# Patient Record
Sex: Female | Born: 1955 | Race: White | Hispanic: No | Smoking: Never smoker
Health system: Southern US, Community
[De-identification: ages and names within clinical notes are randomized; demographics above are authoritative.]

## PROBLEM LIST (undated history)

## (undated) DIAGNOSIS — R32 Unspecified urinary incontinence: Secondary | ICD-10-CM

## (undated) DIAGNOSIS — J189 Pneumonia, unspecified organism: Secondary | ICD-10-CM

## (undated) DIAGNOSIS — M1990 Unspecified osteoarthritis, unspecified site: Secondary | ICD-10-CM

## (undated) DIAGNOSIS — M11261 Other chondrocalcinosis, right knee: Secondary | ICD-10-CM

## (undated) DIAGNOSIS — M545 Low back pain, unspecified: Secondary | ICD-10-CM

## (undated) DIAGNOSIS — F329 Major depressive disorder, single episode, unspecified: Secondary | ICD-10-CM

## (undated) DIAGNOSIS — D519 Vitamin B12 deficiency anemia, unspecified: Secondary | ICD-10-CM

## (undated) DIAGNOSIS — T4145XA Adverse effect of unspecified anesthetic, initial encounter: Secondary | ICD-10-CM

## (undated) DIAGNOSIS — I10 Essential (primary) hypertension: Secondary | ICD-10-CM

## (undated) DIAGNOSIS — F32A Depression, unspecified: Secondary | ICD-10-CM

## (undated) DIAGNOSIS — D509 Iron deficiency anemia, unspecified: Secondary | ICD-10-CM

## (undated) DIAGNOSIS — G971 Other reaction to spinal and lumbar puncture: Secondary | ICD-10-CM

## (undated) DIAGNOSIS — G43909 Migraine, unspecified, not intractable, without status migrainosus: Secondary | ICD-10-CM

## (undated) DIAGNOSIS — T8859XA Other complications of anesthesia, initial encounter: Secondary | ICD-10-CM

## (undated) DIAGNOSIS — Z9109 Other allergy status, other than to drugs and biological substances: Secondary | ICD-10-CM

## (undated) DIAGNOSIS — Z9289 Personal history of other medical treatment: Secondary | ICD-10-CM

## (undated) DIAGNOSIS — G8929 Other chronic pain: Secondary | ICD-10-CM

## (undated) DIAGNOSIS — J45909 Unspecified asthma, uncomplicated: Secondary | ICD-10-CM

## (undated) HISTORY — PX: KNEE ARTHROPLASTY: SHX992

## (undated) HISTORY — PX: KNEE ARTHROSCOPY: SHX127

## (undated) HISTORY — PX: BACK SURGERY: SHX140

## (undated) HISTORY — PX: CARPAL TUNNEL RELEASE: SHX101

## (undated) HISTORY — PX: VAGINAL HYSTERECTOMY: SUR661

## (undated) HISTORY — PX: KNEE RECONSTRUCTION: SHX5883

## (undated) HISTORY — PX: COLONOSCOPY W/ BIOPSIES AND POLYPECTOMY: SHX1376

## (undated) HISTORY — PX: SYNOVIAL CYST EXCISION: SUR507

## (undated) HISTORY — PX: SHOULDER ARTHROSCOPY W/ ROTATOR CUFF REPAIR: SHX2400

## (undated) HISTORY — PX: JOINT REPLACEMENT: SHX530

---

## 1996-12-25 HISTORY — PX: TONSILLECTOMY AND ADENOIDECTOMY: SUR1326

## 2010-01-20 ENCOUNTER — Inpatient Hospital Stay (HOSPITAL_COMMUNITY): Admission: RE | Admit: 2010-01-20 | Discharge: 2010-01-23 | Payer: Self-pay | Admitting: Orthopedic Surgery

## 2011-03-13 LAB — CBC
HCT: 26 % — ABNORMAL LOW (ref 36.0–46.0)
HCT: 35.4 % — ABNORMAL LOW (ref 36.0–46.0)
Hemoglobin: 12.1 g/dL (ref 12.0–15.0)
Hemoglobin: 8.6 g/dL — ABNORMAL LOW (ref 12.0–15.0)
MCHC: 33.4 g/dL (ref 30.0–36.0)
MCHC: 33.8 g/dL (ref 30.0–36.0)
MCHC: 34.2 g/dL (ref 30.0–36.0)
MCV: 89.6 fL (ref 78.0–100.0)
Platelets: 341 10*3/uL (ref 150–400)
Platelets: 373 10*3/uL (ref 150–400)
RBC: 2.92 MIL/uL — ABNORMAL LOW (ref 3.87–5.11)
RBC: 3.99 MIL/uL (ref 3.87–5.11)
RDW: 12.5 % (ref 11.5–15.5)
RDW: 12.5 % (ref 11.5–15.5)
RDW: 12.9 % (ref 11.5–15.5)
WBC: 6.4 10*3/uL (ref 4.0–10.5)

## 2011-03-13 LAB — URINALYSIS, ROUTINE W REFLEX MICROSCOPIC
Glucose, UA: NEGATIVE mg/dL
Hgb urine dipstick: NEGATIVE
Ketones, ur: NEGATIVE mg/dL
Specific Gravity, Urine: 1.021 (ref 1.005–1.030)
Urobilinogen, UA: 0.2 mg/dL (ref 0.0–1.0)
pH: 5.5 (ref 5.0–8.0)

## 2011-03-13 LAB — DIFFERENTIAL
Basophils Relative: 0 % (ref 0–1)
Eosinophils Relative: 3 % (ref 0–5)
Lymphocytes Relative: 35 % (ref 12–46)
Monocytes Absolute: 0.5 10*3/uL (ref 0.1–1.0)
Monocytes Relative: 10 % (ref 3–12)
Neutrophils Relative %: 53 % (ref 43–77)

## 2011-03-13 LAB — BASIC METABOLIC PANEL
BUN: 12 mg/dL (ref 6–23)
CO2: 23 mEq/L (ref 19–32)
Chloride: 108 mEq/L (ref 96–112)
Creatinine, Ser: 0.69 mg/dL (ref 0.4–1.2)
Creatinine, Ser: 1.33 mg/dL — ABNORMAL HIGH (ref 0.4–1.2)
GFR calc Af Amer: 51 mL/min — ABNORMAL LOW (ref >60)
GFR calc non Af Amer: 42 mL/min — ABNORMAL LOW (ref >60)
GFR calc non Af Amer: >60 mL/min (ref >60)
Potassium: 4.5 mEq/L (ref 3.5–5.1)

## 2011-03-13 LAB — CROSSMATCH

## 2011-03-13 LAB — COMPREHENSIVE METABOLIC PANEL
AST: 22 U/L (ref 0–37)
CO2: 29 mEq/L (ref 19–32)
GFR calc Af Amer: >60 mL/min (ref >60)
Sodium: 140 mEq/L (ref 135–145)
Total Protein: 7.3 g/dL (ref 6.0–8.3)

## 2011-03-13 LAB — PROTIME-INR: INR: 0.95 (ref 0.00–1.49)

## 2013-06-10 ENCOUNTER — Encounter (HOSPITAL_COMMUNITY): Payer: Self-pay | Admitting: Pharmacy Technician

## 2013-06-10 NOTE — Pre-Procedure Instructions (Addendum)
Danielle Schmitt  06/10/2013   Your procedure is scheduled on:  Tues, June 24 @ 7:30 AM  Report to [facility] at 5:30 AM.  Call this number if you have problems the morning of surgery: [phone]   Remember:   Do not eat food or drink liquids after midnight.   Take these medicines the morning of surgery with A SIP OF WATER: Albuterol<Bring Your Inhaler With You>,Adderall(Amphetamine-Dextroamphetamine),Cymbalta(Duloxetine),Claritin(Loratadine),Nasonex(Mometasone),and Pain Pill(if needed) Stop taking Aspirin and herbal medications. Do not take any NSAIDs ie: Ibuprofen, Advil, Naproxen or any medication containing Aspirin ( Celebrex)  Do not wear jewelry, make-up or nail polish.  Do not wear lotions, powders, or perfumes. You may wear deodorant.  Do not shave 48 hours prior to surgery.  Do not bring valuables to the hospital.  [facility] is not responsible  for any belongings or valuables.  Contacts, dentures or bridgework may not be worn into surgery.  Leave suitcase in the car. After surgery it may be brought to your room.  For patients admitted to the hospital, checkout time is 11:00 AM the day of discharge.   Patients discharged the day of surgery will not be allowed to drive home.    Special Instructions: Shower using CHG 2 nights before surgery and the night before surgery.  If you shower the day of surgery use CHG.  Use special wash - you have one bottle of CHG for all showers.  You should use approximately 1/3 of the bottle for each shower.   Please read over the following fact sheets that you were given: Pain Booklet, Coughing and Deep Breathing, Blood Transfusion Information, Total Joint Packet, MRSA Information and Surgical Site Infection Prevention

## 2013-06-11 ENCOUNTER — Encounter (HOSPITAL_COMMUNITY): Payer: Self-pay

## 2013-06-11 ENCOUNTER — Encounter (HOSPITAL_COMMUNITY)
Admission: RE | Admit: 2013-06-11 | Discharge: 2013-06-11 | Disposition: A | Discharge status: Home / Self Care | Payer: BC Managed Care – PPO | Source: Ambulatory Visit | Attending: Orthopaedic Surgery | Admitting: Orthopaedic Surgery

## 2013-06-11 DIAGNOSIS — Z0183 Encounter for blood typing: Secondary | ICD-10-CM | POA: Insufficient documentation

## 2013-06-11 DIAGNOSIS — Z01818 Encounter for other preprocedural examination: Secondary | ICD-10-CM | POA: Insufficient documentation

## 2013-06-11 DIAGNOSIS — E669 Obesity, unspecified: Secondary | ICD-10-CM | POA: Insufficient documentation

## 2013-06-11 DIAGNOSIS — I10 Essential (primary) hypertension: Secondary | ICD-10-CM | POA: Insufficient documentation

## 2013-06-11 DIAGNOSIS — Z01812 Encounter for preprocedural laboratory examination: Secondary | ICD-10-CM | POA: Insufficient documentation

## 2013-06-11 HISTORY — DX: Essential (primary) hypertension: I10

## 2013-06-11 HISTORY — DX: Other allergy status, other than to drugs and biological substances: Z91.09

## 2013-06-11 HISTORY — DX: Other complications of anesthesia, initial encounter: T88.59XA

## 2013-06-11 HISTORY — DX: Adverse effect of unspecified anesthetic, initial encounter: T41.45XA

## 2013-06-11 HISTORY — DX: Unspecified osteoarthritis, unspecified site: M19.90

## 2013-06-11 HISTORY — DX: Unspecified asthma, uncomplicated: J45.909

## 2013-06-11 HISTORY — DX: Other reaction to spinal and lumbar puncture: G97.1

## 2013-06-11 LAB — COMPREHENSIVE METABOLIC PANEL
ALT: 21 U/L (ref 0–35)
AST: 21 U/L (ref 0–37)
Albumin: 4.4 g/dL (ref 3.5–5.2)
CO2: 29 mEq/L (ref 19–32)
Chloride: 105 mEq/L (ref 96–112)
GFR calc non Af Amer: >90 mL/min (ref >90)
Potassium: 4.3 mEq/L (ref 3.5–5.1)
Sodium: 143 mEq/L (ref 135–145)
Total Bilirubin: 0.2 mg/dL — ABNORMAL LOW (ref 0.3–1.2)

## 2013-06-11 LAB — URINALYSIS, ROUTINE W REFLEX MICROSCOPIC
Glucose, UA: NEGATIVE mg/dL
Hgb urine dipstick: NEGATIVE
Leukocytes, UA: NEGATIVE
pH: 5.5 (ref 5.0–8.0)

## 2013-06-11 LAB — CBC
Platelets: 342 10*3/uL (ref 150–400)
RBC: 4.1 MIL/uL (ref 3.87–5.11)
RDW: 12.1 % (ref 11.5–15.5)
WBC: 6.1 10*3/uL (ref 4.0–10.5)

## 2013-06-11 LAB — APTT: aPTT: 32 seconds (ref 24–37)

## 2013-06-11 LAB — PROTIME-INR: INR: 0.92 (ref 0.00–1.49)

## 2013-06-11 LAB — SURGICAL PCR SCREEN
MRSA, PCR: NEGATIVE
Staphylococcus aureus: NEGATIVE

## 2013-06-11 NOTE — Progress Notes (Addendum)
Pt denies SOB. Chest pain, and being under the care of a cardiologist. Pt states that she had an EKG and chest x ray recently. Results requested along with latest office notes and any cardiac studies (stress test, echo) from [facility] # [phone] ( Dr. Sherlon Handing (PCP). Urine culture pending.

## 2013-06-12 LAB — URINE CULTURE
Colony Count: NO GROWTH
Culture: NO GROWTH

## 2013-06-12 NOTE — Progress Notes (Signed)
Anesthesia Chart Review:  Patient is a 57 year old female scheduled for left THR on 06/17/13 by Dr. Cleophas Dunker.  History includes obesity, non-smoker, asthma, HTN, anemia, DJD, B12 deficiency, tonsillectomy, left TKA '11, hysterectomy, back and shoulder surgeries.  For anesthesia history, she reports post-operative bronchospasm in the past and spinal headache following an epidural.   PCP is Dr. Frederica Kuster in [place], [place]. Patient was seen for surgical clearance on 04/18/13 by Donnita Falls, NP-C.  CXR on 04/18/13 showed mild right basilar atelectasis.  EKG that same day showed NSR, poor anterior r wave progression, Q wave in III and less significant in aVF.  This EKG was already reviewed at her PCP office, and patient was ultimately cleared for surgery.  Also, I think her EKG is overall stable when compared to a prior EKG from 11/22/09 (scanned in [facility]).   Preoperative labs noted.  If no acute changes then I anticipate that she could proceed as planned.  She will meet her assigned anesthesiologist on the day of surgery to discuss the definitive anesthesia plan.  Velna Ochs [facility]/Anesthesiology Phone [phone] 06/12/2013 2:12 PM

## 2013-06-12 NOTE — Progress Notes (Signed)
Requested chest/ekg/cardiac studies/notes from [facility].

## 2013-06-13 NOTE — H&P (Signed)
CHIEF COMPLAINT:  Painful left hip.   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  She has been seen previously, and there was a suggestion of the fact that she needs a left total hip replacement but was trying to treat this conservatively through a pain control center.  She, however, has been noted to be now using oxycodone and Tylenol at 7.5/325 mg 1 every 6 hours to control her pain and discomfort.  It has worsened now to the point where she really must use a walker to really get around.  She has pain at nighttime as well as pain with activities.  She has difficulty doing her activities of daily living.  It hurts to sit for prolonged periods of time.  She states her pain is a throbbing, aching pain with occasional sharpness especially if her foot gets into the wrong position and the hip twists at all.  She denies any neurovascular compromise.  Denies any radicular-type pain at this time.  Seen today for evaluation.   PAST MEDICAL HISTORY:  Hospitalizations and surgeries:  In 1983 for right knee arthroscopy, 1983 for right knee open surgery, 1989 for left knee arthroscopy, 1990 for right shoulder arthroscopy, 1996 tonsillectomy, 2000 right carpal tunnel release, 1992 for right knee arthroscopy, 2004 for left carpal tunnel release, 2005 right knee arthroscopy, 2011 left knee total knee replacement, 2003 total radial hysterectomy with bilateral oophorectomy, 1995 for status asthmaticus.   MEDICATIONS:  Vitamin D3 1000 units daily, Claritin 10 mg daily, Celebrex 200 mg daily, vitamin D 50,000 IU one 2 times a week, Plaquenil 200 mg 2 tablets daily, lisinopril hydrochlorothiazide 20/25 mg 1 daily, Singulair 10 mg daily, Adderall 20 mg b.i.d., albuterol sulfate 2 puffs q. 4 hours p.r.n., B12 injection 1000 mcg IM monthly, Nasonex nasal spray 50 mcg 1 spray each nostril b.i.d., Cymbalta 60 mg at bedtime,  Percocet 7.5/325 mg 1 q. 6 hours for pain, glucosamine chondroitin, [place] gummy vitamins, calcium 500 mg 2 daily, iron sulfate 75 mg daily.   ALLERGIES:  AUGMENTIN.  She also gets nauseated from MORPHINE.  The AUGMENTIN is actually not allergic to amoxicillin but the CLAVULANIC ACID.   FOURTEEN-POINT REVIEW OF SYSTEMS:  Positive for decreased hearing and glasses.  She does have asthma and does complain of shortness of breath.  Diagnosed with asthma at age 51.  She did have pneumonia in 2012.  She does have a morning cough.  Hypertension diagnosed 4 years ago and is on medications and appears controlled.  She did have an ulcer previously.  She has had anemia and is on B12 and iron sulfate.  She has had a bladder/kidney infection in 2013.  Migraines weekly.  The Cymbalta she uses for her depression.   FAMILY HISTORY:  Positive for hypertension, arthritis, and cancer in her mother.  Father has hypertension and renal disease.  Brother with arthritis and a sister with arthritis and cancer.   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  She denies the use of tobacco or alcohol.   PHYSICAL EXAMINATION:  Today reveals a 57 year old white female, well developed, well nourished, alert and cooperative, in moderate distress.  She is 5 feet 3 inches and weighs 222 pounds.  BMI is 39.3.  Her temperature is 97 degrees.  Pulse 73.  Respirations 15.  Blood pressure 125/82.  Head:  Normocephalic.  Eyes:  Pupils equal,  round, and reactive to light and accommodation with extraocular movements intact.  Ears/Nose/Throat:  Benign.  Neck:  Supple.  No bruits were noted.  Chest:  Had good expansion.  Lungs:  Essentially clear.  Cardiac:  Had regular rhythm and rate with distant heart sounds.  No murmurs noted.  Pulses:  Are 1+ bilaterally in dorsalis pedis bilaterally.  Abdomen:  Scaphoid, soft, nontender.  No mass felt.  Normal bowel sounds are present.  Genital/Rectal/Breast:  Not indicated for an  orthopedic evaluation.  CNS:  She is oriented x3, and cranial nerves II through XII are grossly intact.  Musculoskeletal:  She has minimal internal and external rotation which is quite painful for her.  I can flex her to about 95 degrees, and then she starts having pain.  Negative straight leg raising.  Good strength in the lower extremities.   RADIOGRAPHS:  Studies reveal advanced OA with cyst formations on both the acetabular and femoral sides.  There appears to be partial collapse of the femoral head.   CLINICAL IMPRESSION:   1.  End-stage OA of the left hip. 2.  History of asthma. 3.  Hypertension. 4.  Migraines. 5.  Depression.   RECOMMENDATIONS:  At this time I have reviewed a clearance form by Dr. Jonette Eva who feels that she is a candidate medically for a total hip replacement.  This was actually Donnita Falls, nurse practitioner.  Therefore, our plan is to consider a left total hip arthroplasty.  Procedure, risks, and benefits were fully explained to her, and she is understanding.  Appropriate models were used.  All questions were answered in detail.    Oris Drone Aleda Grana [facility] [phone]  06/13/2013 4:32 PM

## 2013-06-16 MED ORDER — ACETAMINOPHEN 500 MG PO TABS: 1000.0000 mg | ORAL_TABLET | Freq: Four times a day (QID) | ORAL | Status: DC | PRN

## 2013-06-16 MED ORDER — CEFAZOLIN SODIUM 10 G IJ SOLR
3.0000 g | INTRAMUSCULAR | Status: AC
  Administered 2013-06-17: 3 g via INTRAVENOUS
  Filled 2013-06-16: qty 3000

## 2013-06-17 ENCOUNTER — Encounter (HOSPITAL_COMMUNITY): Payer: Self-pay | Admitting: Vascular Surgery

## 2013-06-17 ENCOUNTER — Inpatient Hospital Stay (HOSPITAL_COMMUNITY): Payer: BC Managed Care – PPO

## 2013-06-17 ENCOUNTER — Encounter (HOSPITAL_COMMUNITY)
Admission: RE | Disposition: A | Discharge status: Skilled Nursing Facility | Payer: Self-pay | Source: Ambulatory Visit | Attending: Orthopaedic Surgery

## 2013-06-17 ENCOUNTER — Ambulatory Visit (HOSPITAL_COMMUNITY): Payer: BC Managed Care – PPO | Admitting: Anesthesiology

## 2013-06-17 ENCOUNTER — Inpatient Hospital Stay (HOSPITAL_COMMUNITY)
Admission: RE | Admit: 2013-06-17 | Discharge: 2013-06-20 | DRG: 818 | Disposition: A | Discharge status: Skilled Nursing Facility | Payer: BC Managed Care – PPO | Source: Ambulatory Visit | Attending: Orthopaedic Surgery | Admitting: Orthopaedic Surgery

## 2013-06-17 ENCOUNTER — Encounter (HOSPITAL_COMMUNITY): Payer: Self-pay | Admitting: *Deleted

## 2013-06-17 DIAGNOSIS — M1612 Unilateral primary osteoarthritis, left hip: Secondary | ICD-10-CM | POA: Diagnosis present

## 2013-06-17 DIAGNOSIS — I10 Essential (primary) hypertension: Secondary | ICD-10-CM | POA: Diagnosis present

## 2013-06-17 DIAGNOSIS — F3289 Other specified depressive episodes: Secondary | ICD-10-CM | POA: Diagnosis present

## 2013-06-17 DIAGNOSIS — IMO0002 Reserved for concepts with insufficient information to code with codable children: Secondary | ICD-10-CM

## 2013-06-17 DIAGNOSIS — M169 Osteoarthritis of hip, unspecified: Principal | ICD-10-CM | POA: Diagnosis present

## 2013-06-17 DIAGNOSIS — Z6839 Body mass index (BMI) 39.0-39.9, adult: Secondary | ICD-10-CM

## 2013-06-17 DIAGNOSIS — Z8249 Family history of ischemic heart disease and other diseases of the circulatory system: Secondary | ICD-10-CM

## 2013-06-17 DIAGNOSIS — F329 Major depressive disorder, single episode, unspecified: Secondary | ICD-10-CM | POA: Diagnosis present

## 2013-06-17 DIAGNOSIS — J45909 Unspecified asthma, uncomplicated: Secondary | ICD-10-CM | POA: Clinically undetermined

## 2013-06-17 DIAGNOSIS — Z8261 Family history of arthritis: Secondary | ICD-10-CM

## 2013-06-17 DIAGNOSIS — E538 Deficiency of other specified B group vitamins: Secondary | ICD-10-CM | POA: Diagnosis present

## 2013-06-17 DIAGNOSIS — G43909 Migraine, unspecified, not intractable, without status migrainosus: Secondary | ICD-10-CM | POA: Diagnosis present

## 2013-06-17 DIAGNOSIS — M1610 Unilateral primary osteoarthritis, unspecified hip: Principal | ICD-10-CM | POA: Diagnosis present

## 2013-06-17 DIAGNOSIS — E871 Hypo-osmolality and hyponatremia: Secondary | ICD-10-CM | POA: Diagnosis present

## 2013-06-17 DIAGNOSIS — D62 Acute posthemorrhagic anemia: Secondary | ICD-10-CM | POA: Diagnosis not present

## 2013-06-17 HISTORY — PX: TOTAL HIP ARTHROPLASTY: SHX124

## 2013-06-17 SURGERY — ARTHROPLASTY, HIP, TOTAL,POSTERIOR APPROACH
Anesthesia: General | Site: Hip | Laterality: Left | Wound class: Clean

## 2013-06-17 MED ORDER — HYDROMORPHONE HCL PF 1 MG/ML IJ SOLN
INTRAMUSCULAR | Status: AC
  Filled 2013-06-17: qty 1

## 2013-06-17 MED ORDER — ONDANSETRON HCL 4 MG PO TABS: 4.0000 mg | ORAL_TABLET | Freq: Four times a day (QID) | ORAL | Status: DC | PRN

## 2013-06-17 MED ORDER — HYDROCHLOROTHIAZIDE 25 MG PO TABS
25.0000 mg | ORAL_TABLET | Freq: Every day | ORAL | Status: DC
  Administered 2013-06-18 – 2013-06-20 (×2): 25 mg via ORAL
  Filled 2013-06-17 (×3): qty 1

## 2013-06-17 MED ORDER — THROMBIN 20000 UNITS EX KIT
PACK | CUTANEOUS | Status: DC | PRN
  Administered 2013-06-17: 20000 [IU] via TOPICAL

## 2013-06-17 MED ORDER — MENTHOL 3 MG MT LOZG: 1.0000 | LOZENGE | OROMUCOSAL | Status: DC | PRN

## 2013-06-17 MED ORDER — PHENOL 1.4 % MT LIQD: 1.0000 | OROMUCOSAL | Status: DC | PRN

## 2013-06-17 MED ORDER — FLEET ENEMA 7-19 GM/118ML RE ENEM: 1.0000 | ENEMA | Freq: Once | RECTAL | Status: AC | PRN

## 2013-06-17 MED ORDER — OXYCODONE HCL 5 MG PO TABS: 5.0000 mg | ORAL_TABLET | Freq: Once | ORAL | Status: DC | PRN

## 2013-06-17 MED ORDER — KETOROLAC TROMETHAMINE 30 MG/ML IJ SOLN
INTRAMUSCULAR | Status: AC
  Filled 2013-06-17: qty 1

## 2013-06-17 MED ORDER — METOCLOPRAMIDE HCL 10 MG PO TABS: 5.0000 mg | ORAL_TABLET | Freq: Three times a day (TID) | ORAL | Status: DC | PRN

## 2013-06-17 MED ORDER — BISACODYL 10 MG RE SUPP: 10.0000 mg | Freq: Every day | RECTAL | Status: DC | PRN

## 2013-06-17 MED ORDER — KETOROLAC TROMETHAMINE 15 MG/ML IJ SOLN
15.0000 mg | Freq: Four times a day (QID) | INTRAMUSCULAR | Status: AC
  Administered 2013-06-17: 15 mg via INTRAVENOUS
  Filled 2013-06-17: qty 1

## 2013-06-17 MED ORDER — PROMETHAZINE HCL 25 MG/ML IJ SOLN: 6.2500 mg | INTRAMUSCULAR | Status: DC | PRN

## 2013-06-17 MED ORDER — PROPOFOL 10 MG/ML IV BOLUS
INTRAVENOUS | Status: DC | PRN
  Administered 2013-06-17: 14 mg via INTRAVENOUS

## 2013-06-17 MED ORDER — MIDAZOLAM HCL 5 MG/5ML IJ SOLN
INTRAMUSCULAR | Status: DC | PRN
  Administered 2013-06-17: 2 mg via INTRAVENOUS

## 2013-06-17 MED ORDER — OXYCODONE HCL 5 MG PO TABS
ORAL_TABLET | ORAL | Status: AC
  Filled 2013-06-17: qty 2

## 2013-06-17 MED ORDER — FLUTICASONE PROPIONATE 50 MCG/ACT NA SUSP
1.0000 | Freq: Every day | NASAL | Status: DC
  Administered 2013-06-18 – 2013-06-19 (×2): 1 via NASAL
  Filled 2013-06-17: qty 16

## 2013-06-17 MED ORDER — LISINOPRIL 20 MG PO TABS
20.0000 mg | ORAL_TABLET | Freq: Every day | ORAL | Status: DC
  Administered 2013-06-18 – 2013-06-20 (×2): 20 mg via ORAL
  Filled 2013-06-17 (×3): qty 1

## 2013-06-17 MED ORDER — LISINOPRIL-HYDROCHLOROTHIAZIDE 20-25 MG PO TABS: 1.0000 | ORAL_TABLET | Freq: Every day | ORAL | Status: DC

## 2013-06-17 MED ORDER — DULOXETINE HCL 60 MG PO CPEP
60.0000 mg | ORAL_CAPSULE | Freq: Every day | ORAL | Status: DC
  Administered 2013-06-18 – 2013-06-20 (×3): 60 mg via ORAL
  Filled 2013-06-17 (×3): qty 1

## 2013-06-17 MED ORDER — ONDANSETRON HCL 4 MG/2ML IJ SOLN: 4.0000 mg | Freq: Four times a day (QID) | INTRAMUSCULAR | Status: DC | PRN

## 2013-06-17 MED ORDER — ACETAMINOPHEN 325 MG PO TABS
650.0000 mg | ORAL_TABLET | Freq: Four times a day (QID) | ORAL | Status: DC | PRN
  Administered 2013-06-17 – 2013-06-18 (×2): 650 mg via ORAL
  Filled 2013-06-17 (×3): qty 2

## 2013-06-17 MED ORDER — RIVAROXABAN 10 MG PO TABS
10.0000 mg | ORAL_TABLET | Freq: Every day | ORAL | Status: DC
  Administered 2013-06-17 – 2013-06-19 (×3): 10 mg via ORAL
  Filled 2013-06-17 (×4): qty 1

## 2013-06-17 MED ORDER — ALBUTEROL SULFATE HFA 108 (90 BASE) MCG/ACT IN AERS
1.0000 | INHALATION_SPRAY | Freq: Four times a day (QID) | RESPIRATORY_TRACT | Status: DC | PRN
  Administered 2013-06-17 – 2013-06-18 (×2): 1 via RESPIRATORY_TRACT
  Filled 2013-06-17 (×2): qty 6.7

## 2013-06-17 MED ORDER — ROCURONIUM BROMIDE 100 MG/10ML IV SOLN
INTRAVENOUS | Status: DC | PRN
  Administered 2013-06-17: 50 mg via INTRAVENOUS

## 2013-06-17 MED ORDER — CHLORHEXIDINE GLUCONATE 4 % EX LIQD: 60.0000 mL | Freq: Once | CUTANEOUS | Status: DC

## 2013-06-17 MED ORDER — ONDANSETRON HCL 4 MG/2ML IJ SOLN
INTRAMUSCULAR | Status: DC | PRN
  Administered 2013-06-17: 4 mg via INTRAVENOUS

## 2013-06-17 MED ORDER — HYDROXYCHLOROQUINE SULFATE 200 MG PO TABS
200.0000 mg | ORAL_TABLET | Freq: Every day | ORAL | Status: DC
  Administered 2013-06-18 – 2013-06-20 (×3): 200 mg via ORAL
  Filled 2013-06-17 (×3): qty 1

## 2013-06-17 MED ORDER — BUPIVACAINE-EPINEPHRINE PF 0.25-1:200000 % IJ SOLN
INTRAMUSCULAR | Status: AC
  Filled 2013-06-17: qty 30

## 2013-06-17 MED ORDER — LIDOCAINE HCL 4 % MT SOLN
OROMUCOSAL | Status: DC | PRN
  Administered 2013-06-17: 4 mL via TOPICAL

## 2013-06-17 MED ORDER — MAGNESIUM HYDROXIDE 400 MG/5ML PO SUSP: 30.0000 mL | Freq: Every day | ORAL | Status: DC | PRN

## 2013-06-17 MED ORDER — SODIUM CHLORIDE 0.9 % IV SOLN: INTRAVENOUS | Status: DC

## 2013-06-17 MED ORDER — BUPIVACAINE-EPINEPHRINE 0.25% -1:200000 IJ SOLN
INTRAMUSCULAR | Status: DC | PRN
  Administered 2013-06-17: 30 mL

## 2013-06-17 MED ORDER — MONTELUKAST SODIUM 10 MG PO TABS
10.0000 mg | ORAL_TABLET | Freq: Every day | ORAL | Status: DC
  Administered 2013-06-17 – 2013-06-19 (×3): 10 mg via ORAL
  Filled 2013-06-17 (×4): qty 1

## 2013-06-17 MED ORDER — ALUM & MAG HYDROXIDE-SIMETH 200-200-20 MG/5ML PO SUSP: 30.0000 mL | ORAL | Status: DC | PRN

## 2013-06-17 MED ORDER — LACTATED RINGERS IV SOLN
INTRAVENOUS | Status: DC | PRN
  Administered 2013-06-17 (×2): via INTRAVENOUS

## 2013-06-17 MED ORDER — AMPHETAMINE-DEXTROAMPHETAMINE 10 MG PO TABS
20.0000 mg | ORAL_TABLET | Freq: Two times a day (BID) | ORAL | Status: DC
  Filled 2013-06-17: qty 2

## 2013-06-17 MED ORDER — OXYCODONE HCL 5 MG PO TABS
5.0000 mg | ORAL_TABLET | ORAL | Status: DC | PRN
  Administered 2013-06-17 – 2013-06-20 (×21): 10 mg via ORAL
  Filled 2013-06-17 (×20): qty 2

## 2013-06-17 MED ORDER — CHLORHEXIDINE GLUCONATE 4 % EX LIQD: 60.0000 mL | Freq: Every day | CUTANEOUS | Status: DC

## 2013-06-17 MED ORDER — SODIUM CHLORIDE 0.9 % IR SOLN
Status: DC | PRN
  Administered 2013-06-17 (×2): 1

## 2013-06-17 MED ORDER — THROMBIN 20000 UNITS EX KIT
PACK | CUTANEOUS | Status: AC
  Filled 2013-06-17: qty 1

## 2013-06-17 MED ORDER — CEFAZOLIN SODIUM-DEXTROSE 2-3 GM-% IV SOLR
2.0000 g | Freq: Four times a day (QID) | INTRAVENOUS | Status: AC
  Administered 2013-06-17 (×2): 2 g via INTRAVENOUS
  Filled 2013-06-17 (×2): qty 50

## 2013-06-17 MED ORDER — KETOROLAC TROMETHAMINE 15 MG/ML IJ SOLN
15.0000 mg | Freq: Once | INTRAMUSCULAR | Status: AC
  Administered 2013-06-17: 15 mg via INTRAVENOUS

## 2013-06-17 MED ORDER — OXYCODONE HCL 5 MG PO TABS
ORAL_TABLET | ORAL | Status: AC
  Administered 2013-06-17: 5 mg
  Filled 2013-06-17: qty 1

## 2013-06-17 MED ORDER — METHOCARBAMOL 500 MG PO TABS
500.0000 mg | ORAL_TABLET | Freq: Four times a day (QID) | ORAL | Status: DC | PRN
  Administered 2013-06-17 – 2013-06-20 (×10): 500 mg via ORAL
  Filled 2013-06-17 (×11): qty 1

## 2013-06-17 MED ORDER — LORATADINE 10 MG PO TABS
10.0000 mg | ORAL_TABLET | Freq: Every day | ORAL | Status: DC
  Administered 2013-06-18 – 2013-06-20 (×3): 10 mg via ORAL
  Filled 2013-06-17 (×3): qty 1

## 2013-06-17 MED ORDER — LIDOCAINE HCL (CARDIAC) 20 MG/ML IV SOLN
INTRAVENOUS | Status: DC | PRN
  Administered 2013-06-17: 80 mg via INTRAVENOUS

## 2013-06-17 MED ORDER — METHOCARBAMOL 500 MG PO TABS
ORAL_TABLET | ORAL | Status: AC
  Administered 2013-06-17: 500 mg
  Filled 2013-06-17: qty 1

## 2013-06-17 MED ORDER — METOCLOPRAMIDE HCL 5 MG/ML IJ SOLN: 5.0000 mg | Freq: Three times a day (TID) | INTRAMUSCULAR | Status: DC | PRN

## 2013-06-17 MED ORDER — VITAMIN D3 25 MCG (1000 UNIT) PO TABS
1000.0000 [IU] | ORAL_TABLET | Freq: Every day | ORAL | Status: DC
  Administered 2013-06-18 – 2013-06-20 (×3): 1000 [IU] via ORAL
  Filled 2013-06-17 (×3): qty 1

## 2013-06-17 MED ORDER — SODIUM CHLORIDE 0.9 % IV SOLN
INTRAVENOUS | Status: DC
  Administered 2013-06-17: 22:00:00 via INTRAVENOUS

## 2013-06-17 MED ORDER — OXYCODONE HCL 5 MG/5ML PO SOLN: 5.0000 mg | Freq: Once | ORAL | Status: DC | PRN

## 2013-06-17 MED ORDER — HYDROMORPHONE HCL PF 1 MG/ML IJ SOLN
0.2500 mg | INTRAMUSCULAR | Status: DC | PRN
  Administered 2013-06-17 (×4): 0.5 mg via INTRAVENOUS

## 2013-06-17 MED ORDER — FENTANYL CITRATE 0.05 MG/ML IJ SOLN
INTRAMUSCULAR | Status: DC | PRN
  Administered 2013-06-17: 150 ug via INTRAVENOUS
  Administered 2013-06-17 (×3): 100 ug via INTRAVENOUS
  Administered 2013-06-17: 50 ug via INTRAVENOUS

## 2013-06-17 MED ORDER — DOCUSATE SODIUM 100 MG PO CAPS
100.0000 mg | ORAL_CAPSULE | Freq: Two times a day (BID) | ORAL | Status: DC
  Administered 2013-06-17 – 2013-06-20 (×6): 100 mg via ORAL
  Filled 2013-06-17 (×8): qty 1

## 2013-06-17 MED ORDER — METHOCARBAMOL 100 MG/ML IJ SOLN
500.0000 mg | Freq: Four times a day (QID) | INTRAVENOUS | Status: DC | PRN
  Filled 2013-06-17: qty 5

## 2013-06-17 MED ORDER — ACETAMINOPHEN 650 MG RE SUPP: 650.0000 mg | Freq: Four times a day (QID) | RECTAL | Status: DC | PRN

## 2013-06-17 MED ORDER — HYDROMORPHONE HCL PF 1 MG/ML IJ SOLN
0.5000 mg | INTRAMUSCULAR | Status: DC | PRN
  Administered 2013-06-17 – 2013-06-19 (×8): 0.5 mg via INTRAVENOUS
  Filled 2013-06-17 (×8): qty 1

## 2013-06-17 SURGICAL SUPPLY — 64 items
BLADE SAW SAG 73X25 THK (BLADE) ×1
BLADE SAW SGTL 73X25 THK (BLADE) ×1 IMPLANT
BRUSH FEMORAL CANAL (MISCELLANEOUS) IMPLANT
CAPT HIP PF MOP ×1 IMPLANT
CLOTH BEACON ORANGE TIMEOUT ST (SAFETY) ×2 IMPLANT
COVER BACK TABLE 24X17X13 BIG (DRAPES) IMPLANT
COVER SURGICAL LIGHT HANDLE (MISCELLANEOUS) ×2 IMPLANT
DRAPE INCISE IOBAN 66X45 STRL (DRAPES) ×1 IMPLANT
DRAPE ORTHO SPLIT 77X108 STRL (DRAPES) ×4
DRAPE SURG ORHT 6 SPLT 77X108 (DRAPES) ×2 IMPLANT
DRSG MEPILEX BORDER 4X12 (GAUZE/BANDAGES/DRESSINGS) ×2 IMPLANT
DURAPREP 26ML APPLICATOR (WOUND CARE) ×3 IMPLANT
ELECT BLADE 6.5 EXT (BLADE) ×1 IMPLANT
ELECT REM PT RETURN 9FT ADLT (ELECTROSURGICAL) ×2
ELECTRODE REM PT RTRN 9FT ADLT (ELECTROSURGICAL) ×1 IMPLANT
EVACUATOR 1/8 PVC DRAIN (DRAIN) IMPLANT
FACESHIELD LNG OPTICON STERILE (SAFETY) ×6 IMPLANT
GLOVE BIO SURGEON STRL SZ 6.5 (GLOVE) ×1 IMPLANT
GLOVE BIO SURGEON STRL SZ7 (GLOVE) ×1 IMPLANT
GLOVE BIOGEL PI IND STRL 8 (GLOVE) ×2 IMPLANT
GLOVE BIOGEL PI IND STRL 8.5 (GLOVE) ×1 IMPLANT
GLOVE BIOGEL PI INDICATOR 8 (GLOVE) ×2
GLOVE BIOGEL PI INDICATOR 8.5 (GLOVE) ×1
GLOVE ECLIPSE 8.0 STRL XLNG CF (GLOVE) ×2 IMPLANT
GLOVE SS BIOGEL STRL SZ 6.5 (GLOVE) IMPLANT
GLOVE SUPERSENSE BIOGEL SZ 6.5 (GLOVE) ×1
GLOVE SURG ORTHO 8.5 STRL (GLOVE) ×4 IMPLANT
GOWN PREVENTION PLUS XXLARGE (GOWN DISPOSABLE) ×4 IMPLANT
GOWN STRL NON-REIN LRG LVL3 (GOWN DISPOSABLE) ×4 IMPLANT
HANDPIECE INTERPULSE COAX TIP (DISPOSABLE)
IMMOBILIZER KNEE 20 (SOFTGOODS)
IMMOBILIZER KNEE 20 THIGH 36 (SOFTGOODS) IMPLANT
IMMOBILIZER KNEE 22 UNIV (SOFTGOODS) ×1 IMPLANT
IMMOBILIZER KNEE 24 THIGH 36 (MISCELLANEOUS) IMPLANT
IMMOBILIZER KNEE 24 UNIV (MISCELLANEOUS)
KIT BASIN OR (CUSTOM PROCEDURE TRAY) ×2 IMPLANT
KIT ROOM TURNOVER OR (KITS) ×2 IMPLANT
MANIFOLD NEPTUNE II (INSTRUMENTS) ×2 IMPLANT
NEEDLE 22X1 1/2 (OR ONLY) (NEEDLE) ×2 IMPLANT
NS IRRIG 1000ML POUR BTL (IV SOLUTION) ×3 IMPLANT
PACK TOTAL JOINT (CUSTOM PROCEDURE TRAY) ×2 IMPLANT
PAD ARMBOARD 7.5X6 YLW CONV (MISCELLANEOUS) ×4 IMPLANT
PRESSURIZER FEMORAL UNIV (MISCELLANEOUS) IMPLANT
SET HNDPC FAN SPRY TIP SCT (DISPOSABLE) IMPLANT
SPONGE LAP 18X18 X RAY DECT (DISPOSABLE) ×1 IMPLANT
STAPLER VISISTAT 35W (STAPLE) ×2 IMPLANT
SUCTION FRAZIER TIP 10 FR DISP (SUCTIONS) ×2 IMPLANT
SUT BONE WAX W31G (SUTURE) IMPLANT
SUT ETHIBOND NAB CT1 #1 30IN (SUTURE) ×6 IMPLANT
SUT MNCRL AB 3-0 PS2 18 (SUTURE) ×1 IMPLANT
SUT VIC AB 0 CT1 27 (SUTURE) ×2
SUT VIC AB 0 CT1 27XBRD ANBCTR (SUTURE) ×3 IMPLANT
SUT VIC AB 1 CT1 27 (SUTURE) ×2
SUT VIC AB 1 CT1 27XBRD ANBCTR (SUTURE) IMPLANT
SUT VIC AB 2-0 CT1 27 (SUTURE) ×2
SUT VIC AB 2-0 CT1 TAPERPNT 27 (SUTURE) ×1 IMPLANT
SYR BULB IRRIGATION 50ML (SYRINGE) ×1 IMPLANT
SYR CONTROL 10ML LL (SYRINGE) ×2 IMPLANT
TOWEL OR 17X24 6PK STRL BLUE (TOWEL DISPOSABLE) ×2 IMPLANT
TOWEL OR 17X26 10 PK STRL BLUE (TOWEL DISPOSABLE) ×2 IMPLANT
TOWER CARTRIDGE SMART MIX (DISPOSABLE) IMPLANT
TRAY FOLEY CATH 14FR (SET/KITS/TRAYS/PACK) ×2 IMPLANT
TUBE CONNECTING 12X1/4 (SUCTIONS) ×1 IMPLANT
WATER STERILE IRR 1000ML POUR (IV SOLUTION) ×6 IMPLANT

## 2013-06-17 NOTE — Progress Notes (Signed)
Report given to lauren rn as caregiver 

## 2013-06-17 NOTE — Anesthesia Postprocedure Evaluation (Signed)
  Anesthesia Post-op Note  Patient: Danielle Schmitt  Procedure(s) Performed: Procedure(s): LEFT TOTAL HIP ARTHROPLASTY (Left)  Patient Location: PACU  Anesthesia Type:General  Level of Consciousness: awake and alert   Airway and Oxygen Therapy: Patient Spontanous Breathing  Post-op Pain: mild  Post-op Assessment: Post-op Vital signs reviewed  Post-op Vital Signs: stable  Complications: No apparent anesthesia complications

## 2013-06-17 NOTE — Preoperative (Signed)
Beta Blockers   Reason not to administer Beta Blockers:Not Applicable 

## 2013-06-17 NOTE — Progress Notes (Signed)
Report from [facility], I continue care from 11am. Patient has had multiple pain medications and a muscle relaxer, patient resting comfortably. VSS. No complaints at this time.

## 2013-06-17 NOTE — Progress Notes (Signed)
Patient ID: DARLENE BROZOWSKI, female   DOB: 03-19-1956, 57 y.o.   MRN: [id number] PATIENT ID:      ALEATHIA PURDY  MRN:     [id number] DOB/AGE:    March 12, 1956 / 57 y.o.       OPERATIVE REPORT    DATE OF PROCEDURE:  06/17/2013       PREOPERATIVE DIAGNOSIS:   Left Hip Osteoarthritis                                                       Obesity BMI 39     POSTOPERATIVE DIAGNOSIS:   Left Hip Osteoarthritis                                                                     There is no weight on file to calculate BMI.     PROCEDURE:  Procedure(s): LEFT TOTAL HIP ARTHROPLASTY left     SURGEON:  [name], MD    ASSISTANT:   [name], PA-C   (Present and scrubbed throughout the case, critical for assistance with exposure, retraction, instrumentation, and closure.)          ANESTHESIA: general     DRAINS: none :      TOURNIQUET TIME: * No tourniquets in log *    COMPLICATIONS:  None   CONDITION:  stable  PROCEDURE IN DETAIL: [id number]   Nikitas Davtyan W 06/17/2013, 9:39 AM

## 2013-06-17 NOTE — Anesthesia Preprocedure Evaluation (Addendum)
Anesthesia Evaluation  Patient identified by MRN, date of birth, ID band Patient awake    Reviewed: Allergy & Precautions, H&P , NPO status , Patient's Chart, lab work & pertinent test results  History of Anesthesia Complications (+) POST - OP SPINAL HEADACHE  Airway Mallampati: I  Neck ROM: Full    Dental  (+) Teeth Intact and Dental Advisory Given   Pulmonary asthma ,  breath sounds clear to auscultation        Cardiovascular hypertension, Pt. on medications Rhythm:Regular Rate:Normal     Neuro/Psych  Headaches,    GI/Hepatic   Endo/Other    Renal/GU      Musculoskeletal  (+) Arthritis -,   Abdominal (+) + obese,   Peds  Hematology   Anesthesia Other Findings   Reproductive/Obstetrics                         Anesthesia Physical Anesthesia Plan  ASA: III  Anesthesia Plan: General   Post-op Pain Management:    Induction: Intravenous  Airway Management Planned: Oral ETT  Additional Equipment:   Intra-op Plan:   Post-operative Plan: Extubation in OR  Informed Consent: I have reviewed the patients History and Physical, chart, labs and discussed the procedure including the risks, benefits and alternatives for the proposed anesthesia with the patient or authorized representative who has indicated his/her understanding and acceptance.   Dental advisory given  Plan Discussed with: CRNA and Surgeon  Anesthesia Plan Comments:         Anesthesia Quick Evaluation

## 2013-06-17 NOTE — H&P (Signed)
  The recent History & Physical has been reviewed. I have personally examined the patient today. There is no interval change to the documented History & Physical. The patient would like to proceed with the procedure.  [name] W 06/17/2013,  7:19 AM

## 2013-06-17 NOTE — Plan of Care (Signed)
Problem: Consults Goal: Diagnosis- Total Joint Replacement Primary Total Hip     

## 2013-06-17 NOTE — Transfer of Care (Signed)
Immediate Anesthesia Transfer of Care Note  Patient: Danielle Schmitt  Procedure(s) Performed: Procedure(s): LEFT TOTAL HIP ARTHROPLASTY (Left)  Patient Location: PACU  Anesthesia Type:General  Level of Consciousness: awake  Airway & Oxygen Therapy: Patient Spontanous Breathing and Patient connected to nasal cannula oxygen  Post-op Assessment: Report given to PACU RN, Post -op Vital signs reviewed and stable and Patient moving all extremities  Post vital signs: Reviewed and stable  Complications: No apparent anesthesia complications

## 2013-06-17 NOTE — Op Note (Signed)
NAMEGURPREET, MARIANI NO.:  [id number]  MEDICAL RECORD NO.:  [id number]  LOCATION:  [facility]                         FACILITY:  [facility]  PHYSICIAN:  Claude Manges. Aliou Mealey, M.D.DATE OF BIRTH:  1956/10/15  DATE OF PROCEDURE:  06/17/2013 DATE OF DISCHARGE:                              OPERATIVE REPORT   PREOPERATIVE DIAGNOSIS: 1. End-stage osteoarthritis, left hip. 2. Obesity with BMI of 39.  POSTOPERATIVE DIAGNOSIS: 1. End-stage osteoarthritis, left hip. 2. Obesity with BMI of 39.  PROCEDURE:  Left total hip replacement.  SURGEON:  Claude Manges. Cleophas Dunker, M.D.  ASSISTANT:  Arlys John D. Petrarca, P.A.-C.  ANESTHESIA:  General.  COMPLICATIONS:  None.  COMPONENTS:  DePuy AML 12.5 mm small stature femoral component, and a 36 mm outer diameter metallic femoral head with a 1.5 mm neck length, a 52 mm outer diameter sector 3 acetabular component with an apex hole eliminator with single 20 mm acetabular screw, and a Marathon polyethylene liner with a 10 degree posterior lip.  Components were press-fit.  DESCRIPTION OF PROCEDURE:  Ms. Lalla was met in the holding area, identified the left as the appropriate operative site.  She was slightly shorter on the left lower extremity compared to the right approximately a quarter of an inch.  The patient was then transported to room #8, placed under general anesthesia.  Nursing staff inserted a Foley catheter.  Urine was clear.  The patient was then placed in the lateral decubitus position with the left side up and secured to the operating table with the Innomed hip system.  The left lower extremity was prepped from the iliac crest.  The tip of the toes with chlorhexidine scrub and then DuraPrep.  Sterile draping was performed.  A routine Southern incision was utilized and via sharp dissection, carried down through subcutaneous tissue.  Gross bleeders were Bovie coagulated.  There was abundant adipose tissue that was incised to  the level of the iliotibial band.  The IT band was then incised with the Bovie.  There was very minimal motion of the hip and therefore visualization of the short external rotators was difficult.  I could palpate the sciatic nerve, and it was careful to protect it throughout the procedure.  The short external rotators were incised.  The capsule was identified and incised.  Joint was entered.  There was a clear yellow joint effusion.  The hip was then dislocated posteriorly.  The head was misshapen, and that was very little if any articular cartilage remaining and there were obvious cyst in the head using the calcar guide, the head was osteotomized at its head neck junction.  Center hole was then made through the piriformis fossa and reaming of the canal finder was inserted.  Reaming was performed to 11.5 mm to accept a 12 mm component.  Rasping was then performed sequentially to 12.5.  Calcar reamer was inserted.  The ultimate cut was approximate a fingerbreadth proximal to the lesser trochanter.  Retractors were then placed about the acetabulum.  There were large osteophytes, which were removed.  The labrum was sharply excised.  Reaming was then performed sequentially to 51 mm to accept a 52-mm component.  I trialed a 50 mm component.  They had nice press fit, and tight rim fit, the 52 would not completely seat.  I accordingly inserted the Sector 3 Gription metallic acetabular component.  I had a very nice fit and it was nice and tight.  I then inserted the trial polyethylene liner followed by the 12.5 mm femoral rasp.  We trialed several neck lengths and felt that the 1/2 neck length with a 36 mm outer diameter hip ball was an excellent fit.  There was no toggling.  There was no instability.  The trial components were removed.  I elected to use a single acetabular screw.  I reamed to 20 mm and inserted the acetabular screw followed by the apex hole eliminator.  The  Marathon polyethylene liner was then impacted.  Throughout the procedure, the wound was irrigated and the sciatic nerve was carefully protected.  The femoral component was then impacted onto the femoral neck.  We again trialed 1/2 neck length with a 36 mm outer diameter hip ball, felt like we had perfect stability.  Then used thrombin spray in the wound.  I closed the capsule with #1 Ethibond.  The short external rotators with same material.  The iliotibial band was closed with a running 0 Vicryl.  Subcu was closed in several layers with 2-0 Vicryl.  Subcu was closed with 3-0 Monocryl.  Skin closed with skin clips.  Sterile bulky dressing was applied.  I felt that the leg lengths were symmetrical.  The patient tolerated the procedure well without complications.     Claude Manges. Cleophas Dunker, M.D.     PWW/MEDQ  D:  06/17/2013  T:  06/17/2013  Job:  [id number]

## 2013-06-17 NOTE — Anesthesia Procedure Notes (Signed)
Procedure Name: Intubation Date/Time: 06/17/2013 7:33 AM Performed by: Charm Barges, Karizma Cheek R Pre-anesthesia Checklist: Patient identified, Emergency Drugs available, Suction available, Patient being monitored and Timeout performed Patient Re-evaluated:Patient Re-evaluated prior to inductionOxygen Delivery Method: Circle system utilized Preoxygenation: Pre-oxygenation with 100% oxygen Intubation Type: IV induction Ventilation: Mask ventilation without difficulty Laryngoscope Size: Mac and 4 Grade View: Grade II Tube type: Oral Tube size: 7.5 mm Number of attempts: 1 Airway Equipment and Method: Stylet Placement Confirmation: ETT inserted through vocal cords under direct vision,  positive ETCO2 and breath sounds checked- equal and bilateral Secured at: 21 cm Tube secured with: Tape Dental Injury: Teeth and Oropharynx as per pre-operative assessment

## 2013-06-18 ENCOUNTER — Inpatient Hospital Stay (HOSPITAL_COMMUNITY): Payer: BC Managed Care – PPO

## 2013-06-18 LAB — CBC
HCT: 24 % — ABNORMAL LOW (ref 36.0–46.0)
HCT: 26.6 % — ABNORMAL LOW (ref 36.0–46.0)
Hemoglobin: 8.4 g/dL — ABNORMAL LOW (ref 12.0–15.0)
Hemoglobin: 9.3 g/dL — ABNORMAL LOW (ref 12.0–15.0)
MCH: 30.7 pg (ref 26.0–34.0)
MCH: 29.9 pg (ref 26.0–34.0)
MCHC: 35 g/dL (ref 30.0–36.0)
MCV: 87.6 fL (ref 78.0–100.0)
MCV: 85.5 fL (ref 78.0–100.0)
RBC: 2.74 MIL/uL — ABNORMAL LOW (ref 3.87–5.11)
RDW: 13 % (ref 11.5–15.5)
WBC: 7.1 10*3/uL (ref 4.0–10.5)

## 2013-06-18 LAB — URINALYSIS, ROUTINE W REFLEX MICROSCOPIC
Bilirubin Urine: NEGATIVE
Leukocytes, UA: NEGATIVE
Nitrite: NEGATIVE
Specific Gravity, Urine: 1.019 (ref 1.005–1.030)
Urobilinogen, UA: 0.2 mg/dL (ref 0.0–1.0)

## 2013-06-18 LAB — BASIC METABOLIC PANEL
BUN: 16 mg/dL (ref 6–23)
CO2: 26 mEq/L (ref 19–32)
Calcium: 7.8 mg/dL — ABNORMAL LOW (ref 8.4–10.5)
Chloride: 100 mEq/L (ref 96–112)
Creatinine, Ser: 0.6 mg/dL (ref 0.50–1.10)
Glucose, Bld: 128 mg/dL — ABNORMAL HIGH (ref 70–99)

## 2013-06-18 LAB — PREPARE RBC (CROSSMATCH)

## 2013-06-18 NOTE — Progress Notes (Signed)
Orthopedic Tech Progress Note Patient Details:  Danielle Schmitt 19-Feb-1956 [id number] Trapeze bar     [name], [name] 06/18/2013, 9:31 AM

## 2013-06-18 NOTE — Clinical Social Work Psychosocial (Signed)
Clinical Social Work Department  BRIEF PSYCHOSOCIAL ASSESSMENT  Patient: Danielle Schmitt  Account Number: [id number]   Admit date: 06/17/13 Clinical Social Worker Sabino Niemann, MSW Date/Time: 06/18/2013 Referred by: Physician Date Referred:  Referred for   SNF Placement   Other Referral:  Interview type: Patient  Other interview type: PSYCHOSOCIAL DATA  Living Status: Alone Admitted from facility:  Level of care: Primary support name: Ellin Mayhew Primary support relationship to patient: Friend Degree of support available:  Fair  CURRENT CONCERNS  Current Concerns   Post-Acute Placement   Other Concerns:  SOCIAL WORK ASSESSMENT / PLAN  CSW met with pt re: PT recommendation for SNF.   Pt lives alone   CSW explained placement process and answered questions.   Pt reports [facility] in [place]  as her preference    CSW completed FL2 and initiated SNF search.     Assessment/plan status: Information/Referral to [facility]  Other assessment/ plan:  Information/referral to community resources:  SNF   PTAR  PATIENT'S/FAMILY'S RESPONSE TO PLAN OF CARE:  Pt  reports she is agreeable to ST SNF in order to increase strength and independence with mobility prior to returning home  Pt verbalized understanding of placement process and appreciation for CSW assist.   Sabino Niemann, MSW [phone]

## 2013-06-18 NOTE — Clinical Social Work Placement (Addendum)
Clinical Social Work Department  CLINICAL SOCIAL WORK PLACEMENT NOTE  06/18/2013  Patient: Danielle Schmitt Account Number: [id number] Admit date: 06/17/13  Clinical Social Worker: Sabino Niemann MSW Date/time: 6/25/201411:30 AM  Clinical Social Work is seeking post-discharge placement for this patient at the following level of care: SKILLED NURSING (*CSW will update this form in Epic as items are completed)  06/18/2013 Patient/family provided with [facility] Department of Clinical Social Work's list of facilities offering this level of care within the geographic area requested by the patient (or if unable, by the patient's family).  06/18/2013 Patient/family informed of their freedom to choose among providers that offer the needed level of care, that participate in Medicare, Medicaid or managed care program needed by the patient, have an available bed and are willing to accept the patient.  06/18/2013 Patient/family informed of [facility]' ownership interest in [facility], as well as of the fact that they are under no obligation to receive care at this facility.  PASARR submitted to EDS on Not needed PASARR number received from EDS on  FL2 transmitted to all facilities in geographic area requested by pt/family on 06/18/2013  FL2 transmitted to all facilities within larger geographic area on  Patient informed that his/her managed care company has contracts with or will negotiate with certain facilities, including the following:  Patient/family informed of bed offers received: 06/19/13  Patient chooses bed at [facility] Physician recommends and patient chooses bed at  Patient to be transferred to on 06/20/2013 Patient to be transferred to facility by [facility] The following physician request were entered in Epic:  Additional Comments:

## 2013-06-18 NOTE — Progress Notes (Signed)
Patient ID: Danielle Schmitt, female   DOB: 06-May-1956, 57 y.o.   MRN: [id number] PATIENT ID: Danielle Schmitt        MRN:  [id number]          DOB/AGE: 09/01/1956 / 57 y.o.    Danielle Campbell, MD   Danielle Code, PA-C [address] [place], [place]  [address]                             [phone]   PROGRESS NOTE  Subjective:  negative for Chest Pain  negative for Shortness of Breath  negative for Nausea/Vomiting   negative for Calf Pain    Tolerating Diet: yes         Patient reports pain as moderate.    Experienced pain along proximal tibia last night-no bruising,redness or swelling-monitor  Objective: Vital signs in last 24 hours:   Patient Vitals for the past 24 hrs:  BP Temp Temp src Pulse Resp SpO2  06/18/13 0529 107/57 mmHg 100.4 F (38 C) - 111 18 98 %  06/18/13 0343 - 100.5 F (38.1 C) Oral - - -  06/18/13 0149 118/58 mmHg 101.6 F (38.7 C) - 116 18 95 %  06/18/13 0043 - 100.5 F (38.1 C) Oral - - -  06/17/13 2133 110/62 mmHg 102.3 F (39.1 C) - 109 18 98 %  06/17/13 1600 - - - - 17 98 %  06/17/13 1540 119/62 mmHg 99.8 F (37.7 C) - 105 16 98 %  06/17/13 1500 132/66 mmHg 98.5 F (36.9 C) - 103 18 95 %  06/17/13 1445 129/71 mmHg - - - - 97 %  06/17/13 1415 110/59 mmHg - - 100 - 96 %  06/17/13 1345 130/67 mmHg - - 109 12 96 %  06/17/13 1315 128/72 mmHg - - 97 19 96 %  06/17/13 1245 129/78 mmHg - - 94 16 100 %  06/17/13 1215 142/76 mmHg - - - - 100 %  06/17/13 1200 142/76 mmHg - - 88 20 99 %  06/17/13 1145 138/73 mmHg 97.1 F (36.2 C) - 86 18 100 %  06/17/13 1130 132/77 mmHg - - 86 14 100 %  06/17/13 1115 133/72 mmHg - - 87 11 100 %  06/17/13 1100 125/82 mmHg - - 88 14 98 %  06/17/13 1045 127/69 mmHg - - 92 14 99 %  06/17/13 1030 127/69 mmHg - - 102 16 99 %  06/17/13 1015 134/76 mmHg 98.1 F (36.7 C) - 90 18 98 %      Intake/Output from previous day:   06/24 0701 - 06/25 0700 In: 3191.3 [P.O.:600; I.V.:2591.3] Out: 1125 [Urine:625]   Intake/Output this  shift:       Intake/Output     06/24 0701 - 06/25 0700 06/25 0701 - 06/26 0700   P.O. 600    I.V. 2591.3    Total Intake 3191.3     Urine 625    Blood 500    Total Output 1125     Net +2066.3             LABORATORY DATA:  Recent Labs  06/11/13 0932 06/18/13 0500  WBC 6.1 7.1  HGB 12.4 8.4*  HCT 36.1 24.0*  PLT 342 266    Recent Labs  06/11/13 0932 06/18/13 0500  NA 143 133*  K 4.3 3.7  CL 105 100  CO2 29 26  BUN 27* 16  CREATININE 0.60  0.60  GLUCOSE 93 128*  CALCIUM 9.5 7.8*   Lab Results  Component Value Date   INR 0.92 06/11/2013   INR 0.95 01/10/2010    Recent Radiographic Studies :  Dg Pelvis Portable  06/17/2013   *RADIOLOGY REPORT*  Clinical Data: Postoperative evaluation.  PORTABLE PELVIS  Comparison: Previous left hip image of same date.  Findings: Left hip arthroplasty has been performed.  There is expected relationship between femoral and acetabular components. No dislocation or disruption of hardware is evident.  IMPRESSION: Left hip arthroplasty has been performed.  No dislocation or disruption of hardware is evident.   Original Report Authenticated By: Onalee Hua Call   Dg Hip Portable 1 View Left  06/17/2013   *RADIOLOGY REPORT*  Clinical Data: Postoperative evaluation.  Post left hip arthroplasty.  PORTABLE LEFT HIP - 1 VIEW  Comparison: None.  Findings: Portable cross-table lateral image is submitted.  Left hip arthroplasty has been performed.  There is expected relationship between acetabular and femoral components.  No dislocation or disruption of hardware is evident.  IMPRESSION: Post left hip arthroplasty.  No dislocation or disruption of hardware is evident.   Original Report Authenticated By: Onalee Hua Call     Examination:  General appearance: alert, cooperative, no distress and morbidly obese  Wound Exam: clean, dry, intact   Drainage:  None: wound tissue dry  Motor Exam: EHL, FHL, Anterior Tibial and Posterior Tibial Intact  Sensory Exam:  Superficial Peroneal, Deep Peroneal and Tibial normal  Vascular Exam: Normal  Assessment:    1 Day Post-Op  Procedure(s) (LRB): LEFT TOTAL HIP ARTHROPLASTY (Left)  ADDITIONAL DIAGNOSIS:  Principal Problem:   Osteoarthritis of left hip Active Problems:   Morbid obesity   Asthma  Acute Blood Loss Anemia-will transfuse 1 unit packed cells   Plan: Physical Therapy as ordered Weight Bearing as Tolerated (WBAT)  DVT Prophylaxis:  Xarelto  DISCHARGE PLAN: Skilled Nursing Facility/Rehab  DISCHARGE NEEDS: HHPT, Walker and 3-in-1 comode seat   OOB with PT, transfuse 1 unit packed cells, post op films with nice position of components      Valeria Batman 06/18/2013 7:49 AM

## 2013-06-18 NOTE — Progress Notes (Signed)
UR COMPLETED  

## 2013-06-18 NOTE — Evaluation (Signed)
Physical Therapy Evaluation Patient Details Name: Danielle Schmitt MRN: [id number] DOB: [birth year] Today's Date: 06/18/2013 Time: 4098-1191 PT Time Calculation (min): 25 min  PT Assessment / Plan / Recommendation History of Present Illness  s/p L THA POD#1.  Clinical Impression  Pt is s/p L THA POD#1 resulting in the deficits listed below (see PT Problem List). Pt limited in therapy today due to pain. Pt will benefit from skilled PT to increase their independence and safety with mobility to allow discharge to ST-SNF to return to PLOF prior to returning home alone.      PT Assessment  Patient needs continued PT services    Follow Up Recommendations  SNF    Does the patient have the potential to tolerate intense rehabilitation      Barriers to Discharge Decreased caregiver support pt lives alone    Equipment Recommendations  None recommended by PT    Recommendations for Other Services     Frequency 7X/week    Precautions / Restrictions Precautions Precautions: Posterior Hip;Fall Precaution Booklet Issued: Yes (comment) Precaution Comments: educated on posterior hip precautions; able to recall 1/3 at end of session  Required Braces or Orthoses: Knee Immobilizer - Left Knee Immobilizer - Left: On when out of bed or walking Restrictions Weight Bearing Restrictions: Yes LLE Weight Bearing: Weight bearing as tolerated   Pertinent Vitals/Pain 9/10 with activity in L Hip;pt given oral pain medicine during session by RN.       Mobility  Bed Mobility Bed Mobility: Supine to Sit Supine to Sit: 4: Min assist;HOB elevated;With rails Details for Bed Mobility Assistance: (A) to advance L LE off EOB; requires increased time and verbal cues for sequencing due to pain  Transfers Transfers: Sit to Stand;Stand to Sit Sit to Stand: 4: Min assist;From bed;With upper extremity assist Stand to Sit: 4: Min assist;To chair/3-in-1;With armrests;With upper extremity assist Details for Transfer  Assistance: required (A) to maintain hip precautions and balance while performing transfers; pt tends to flop into chair; requires verbal cues for hand placement and sequencing Ambulation/Gait Ambulation/Gait Assistance: 4: Min assist Ambulation Distance (Feet): 20 Feet Assistive device: Rolling walker Ambulation/Gait Assistance Details: verbal cues for gt sequencing; unable to increase amb due to 9/10 pain; Gait Pattern: Step-to pattern;Decreased stance time - left;Decreased step length - right;Trunk flexed Gait velocity: decr due to pain Stairs: No Wheelchair Mobility Wheelchair Mobility: No    Exercises Total Joint Exercises Ankle Circles/Pumps: AROM;Both;10 reps;Supine Gluteal Sets: AROM;10 reps;Supine Hip ABduction/ADduction: AAROM;Left;10 reps;Supine   PT Diagnosis: Difficulty walking;Acute pain  PT Problem List: Decreased strength;Decreased range of motion;Decreased activity tolerance;Decreased balance;Decreased mobility;Decreased safety awareness;Decreased knowledge of use of DME;Decreased knowledge of precautions;Pain PT Treatment Interventions: DME instruction;Gait training;Functional mobility training;Therapeutic activities;Therapeutic exercise;Balance training;Neuromuscular re-education;Patient/family education     PT Goals(Current goals can be found in the care plan section) Acute Rehab PT Goals Patient Stated Goal: have less pain in hip PT Goal Formulation: With patient Time For Goal Achievement: 06/25/13 Potential to Achieve Goals: Good  Visit Information  Last PT Received On: 06/18/13 Assistance Needed: +1 History of Present Illness: s/p L THA POD#1.       Prior Functioning  Home Living Family/patient expects to be discharged to:: Skilled nursing facility Prior Function Level of Independence: Independent with assistive device(s) (uses cane PRN) Communication Communication: No difficulties Dominant Hand: Right    Cognition  Cognition Arousal/Alertness:  Awake/alert Behavior During Therapy: Impulsive Overall Cognitive Status: Within Functional Limits for tasks assessed    Extremity/Trunk Assessment Upper  Extremity Assessment Upper Extremity Assessment: Overall WFL for tasks assessed Lower Extremity Assessment Lower Extremity Assessment: LLE deficits/detail LLE Deficits / Details: DF/PF WFL; unable to perform SLR; knee grossly 3+/5 LLE: Unable to fully assess due to pain Cervical / Trunk Assessment Cervical / Trunk Assessment: Normal   Balance Balance Balance Assessed: Yes Static Sitting Balance Static Sitting - Balance Support: Bilateral upper extremity supported;Feet supported Static Sitting - Level of Assistance: 5: Stand by assistance  End of Session PT - End of Session Equipment Utilized During Treatment: Gait belt;Left knee immobilizer Activity Tolerance: Patient limited by pain Patient left: in chair;with call bell/phone within reach Nurse Communication: Mobility status  GP     Donell Sievert, [place] [phone] 06/18/2013, 3:41 PM

## 2013-06-19 DIAGNOSIS — D62 Acute posthemorrhagic anemia: Secondary | ICD-10-CM | POA: Diagnosis not present

## 2013-06-19 LAB — BASIC METABOLIC PANEL
Calcium: 8.2 mg/dL — ABNORMAL LOW (ref 8.4–10.5)
GFR calc non Af Amer: >90 mL/min (ref >90)
Potassium: 4 mEq/L (ref 3.5–5.1)
Sodium: 136 mEq/L (ref 135–145)

## 2013-06-19 LAB — CBC
MCH: 30.4 pg (ref 26.0–34.0)
MCHC: 35 g/dL (ref 30.0–36.0)
Platelets: 238 10*3/uL (ref 150–400)
RBC: 2.76 MIL/uL — ABNORMAL LOW (ref 3.87–5.11)

## 2013-06-19 LAB — URINE CULTURE: Culture: NO GROWTH

## 2013-06-19 MED ORDER — FUROSEMIDE 10 MG/ML IJ SOLN
20.0000 mg | Freq: Once | INTRAMUSCULAR | Status: DC
  Filled 2013-06-19: qty 2

## 2013-06-19 MED ORDER — ALBUTEROL SULFATE HFA 108 (90 BASE) MCG/ACT IN AERS: 2.0000 | INHALATION_SPRAY | Freq: Four times a day (QID) | RESPIRATORY_TRACT | Status: DC | PRN

## 2013-06-19 MED ORDER — FLUTICASONE PROPIONATE 50 MCG/ACT NA SUSP
1.0000 | Freq: Two times a day (BID) | NASAL | Status: DC
  Administered 2013-06-19 – 2013-06-20 (×2): 1 via NASAL

## 2013-06-19 NOTE — Progress Notes (Signed)
Patient ID: Danielle Schmitt, female   DOB: 01/13/1956, 57 y.o.   MRN: [id number] PATIENT ID: Danielle Schmitt        MRN:  [id number]          DOB/AGE: Feb 06, 1956 / 57 y.o.    Danielle Campbell, MD   Danielle Code, PA-C [address] [place], [place]  [address]                             [phone]   PROGRESS NOTE  Subjective:  negative for Chest Pain  positive for Shortness of Breath  negative for Nausea/Vomiting   negative for Calf Pain    Tolerating Diet: yes         Patient reports pain as mild.     Lightheaded when OOB with slight SOB-seems related to decreased Hand H-8/24 same as yesterday despite 1 unit packed cells-fine in bed, afebrile  Objective: Vital signs in last 24 hours:   Patient Vitals for the past 24 hrs:  BP Temp Temp src Pulse Resp SpO2  06/19/13 0500 109/54 mmHg 99.5 F (37.5 C) Oral 92 18 94 %  06/19/13 0130 - 99.2 F (37.3 C) Oral - - -  06/18/13 2055 112/46 mmHg 101.7 F (38.7 C) Oral 107 18 94 %      Intake/Output from previous day:   06/25 0701 - 06/26 0700 In: 610 [P.O.:360; I.V.:250] Out: 4450 [Urine:4450]   Intake/Output this shift:       Intake/Output     06/25 0701 - 06/26 0700 06/26 0701 - 06/27 0700   P.O. 360    I.V. 250    Total Intake 610     Urine 4450    Blood     Total Output 4450     Net -3840          Urine Occurrence 2 x       LABORATORY DATA:  Recent Labs  06/18/13 0500 06/18/13 1540 06/19/13 0445  WBC 7.1 9.8 8.9  HGB 8.4* 9.3* 8.4*  HCT 24.0* 26.6* 24.0*  PLT 266 266 238    Recent Labs  06/18/13 0500 06/19/13 0445  NA 133* 136  K 3.7 4.0  CL 100 102  CO2 26 29  BUN 16 14  CREATININE 0.60 0.61  GLUCOSE 128* 123*  CALCIUM 7.8* 8.2*   Lab Results  Component Value Date   INR 0.92 06/11/2013   INR 0.95 01/10/2010    Recent Radiographic Studies :  Dg Chest 2 View  06/18/2013   *RADIOLOGY REPORT*  Clinical Data: Persistent postoperative elevated temperature.  CHEST - 2 VIEW  Comparison: CT 07/11/2011.   Findings: Chronic elevation of the right hemidiaphragm which appears unchanged compared to CT scout images from 07/11/2011. There is no airspace disease.  No pleural effusion. Cardiopericardial silhouette and mediastinal contours are within normal limits.  IMPRESSION: No active cardiopulmonary disease.  Chronic elevation of the right hemidiaphragm.   Original Report Authenticated By: Andreas Newport, M.D.   Dg Tibia/fibula Left  06/18/2013   *RADIOLOGY REPORT*  Clinical Data: 57 year old female status post recent the left hip arthroplasty.  Left lower extremity pain.  LEFT TIBIA AND FIBULA - 2 VIEW  Comparison: Knee series from the same day.  Intraoperative left hip radiographs 06/17/2013.  Findings: Sequelae of left knee arthroplasty partially re- identified and stable. Globular density at the proximal left tibia diaphysis medially appears to represent bone cement.  Left tibia  and fibula intact.  Grossly normal alignment at the left ankle. Degenerative spurring at the medial, lateral, and posterior malleoli.  IMPRESSION: No acute osseous abnormality identified about the left tib-fib.   Original Report Authenticated By: Erskine Speed, M.D.   Dg Pelvis Portable  06/17/2013   *RADIOLOGY REPORT*  Clinical Data: Postoperative evaluation.  PORTABLE PELVIS  Comparison: Previous left hip image of same date.  Findings: Left hip arthroplasty has been performed.  There is expected relationship between femoral and acetabular components. No dislocation or disruption of hardware is evident.  IMPRESSION: Left hip arthroplasty has been performed.  No dislocation or disruption of hardware is evident.   Original Report Authenticated By: Onalee Hua Call   Dg Hip Portable 1 View Left  06/17/2013   *RADIOLOGY REPORT*  Clinical Data: Postoperative evaluation.  Post left hip arthroplasty.  PORTABLE LEFT HIP - 1 VIEW  Comparison: None.  Findings: Portable cross-table lateral image is submitted.  Left hip arthroplasty has been performed.   There is expected relationship between acetabular and femoral components.  No dislocation or disruption of hardware is evident.  IMPRESSION: Post left hip arthroplasty.  No dislocation or disruption of hardware is evident.   Original Report Authenticated By: Onalee Hua Call   Dg Knee 4 Views W/patella Left  06/18/2013   *RADIOLOGY REPORT*  Clinical Data: Status post left hip replacement  LEFT KNEE - COMPLETE 4+ VIEW  Comparison: None.  Findings: The patient is status post tricompartmental knee replacement.  No fracture.  No subluxation or dislocation.  No evidence for hardware complications.  IMPRESSION: Status post tricompartmental knee replacement without evidence for complicating features.   Original Report Authenticated By: Kennith Center, M.D.     Examination:  General appearance: alert, cooperative, no distress and morbidly obese  Wound Exam: clean, dry, intact   Drainage:  None: wound tissue dry  Motor Exam: EHL, FHL, Anterior Tibial and Posterior Tibial Intact  Sensory Exam: Superficial Peroneal, Deep Peroneal and Tibial normal  Vascular Exam: Normal  Assessment:    2 Days Post-Op  Procedure(s) (LRB): LEFT TOTAL HIP ARTHROPLASTY (Left)  ADDITIONAL DIAGNOSIS:  Principal Problem:   Osteoarthritis of left hip Active Problems:   Morbid obesity   Asthma  Acute Blood Loss Anemia-will transfuse two additional units with lasix in between Hyponatremia-will monitor  Plan: Physical Therapy as ordered Weight Bearing as Tolerated (WBAT)  DVT Prophylaxis:  Xarelto  DISCHARGE PLAN: Skilled Nursing Facility/Rehab-awaiting approval from insurance co  DISCHARGE NEEDS: HHPT, Walker and 3-in-1 comode seat   OOB with PT- hope to stabilize with 2 units blood to allow D/C tomorrow      Valeria Batman 06/19/2013 12:30 PM

## 2013-06-19 NOTE — Progress Notes (Signed)
Physical Therapy Treatment Patient Details Name: Danielle Schmitt MRN: [id number] DOB: 11/02/1956 Today's Date: 06/19/2013 Time: 4098-1191 PT Time Calculation (min): 15 min  PT Assessment / Plan / Recommendation  PT Comments   Pt slowly progressing with therapy. Pt is very anxious and requires max encouragement to participate. Anticipate D/C to SNF tomorrow. Will cont to f/u with pt to maximize functional mobility.   Follow Up Recommendations  SNF     Does the patient have the potential to tolerate intense rehabilitation     Barriers to Discharge        Equipment Recommendations  None recommended by PT    Recommendations for Other Services    Frequency 7X/week   Progress towards PT Goals Progress towards PT goals: Progressing toward goals  Plan Current plan remains appropriate    Precautions / Restrictions Precautions Precautions: Posterior Hip;Fall Precaution Comments: pt able to recall 3/3 hip precautions  Required Braces or Orthoses: Knee Immobilizer - Left Knee Immobilizer - Left: Other (comment) (on in bed ) Restrictions Weight Bearing Restrictions: Yes LLE Weight Bearing: Weight bearing as tolerated   Pertinent Vitals/Pain 9/10 at end of session; pt amb on RA and was at 92% O2 when returned to 2L O2; pt HR at 134 after activity.     Mobility  Bed Mobility Bed Mobility: Supine to Sit Supine to Sit: HOB elevated;5: Supervision Sitting - Scoot to Edge of Bed: 4: Min assist Details for Bed Mobility Assistance: pt required increased time to complete tasks; pt is very anxious with movement and requires max encouragement  Transfers Transfers: Sit to Stand;Stand to Sit Sit to Stand: 4: Min guard;From bed;With upper extremity assist Stand to Sit: 4: Min guard;To chair/3-in-1;With armrests;With upper extremity assist Details for Transfer Assistance: min cues for hand placement and safety and to adhere to hip precautions  Ambulation/Gait Ambulation/Gait Assistance: 4: Min  guard Ambulation Distance (Feet): 70 Feet Assistive device: Rolling walker Ambulation/Gait Assistance Details: verbal cues for gt sequencing and upright posture; pt c/o 8/10 pain today with amb  Gait Pattern: Step-to pattern;Decreased stance time - left;Decreased step length - right;Trunk flexed Gait velocity: decr due to pain Stairs: No Wheelchair Mobility Wheelchair Mobility: No    Exercises     PT Diagnosis:    PT Problem List:   PT Treatment Interventions:     PT Goals (current goals can now be found in the care plan section) Acute Rehab PT Goals Patient Stated Goal: feel better  Visit Information  Last PT Received On: 06/19/13 Assistance Needed: +1 History of Present Illness: s/p posterior L THA POD#1.    Subjective Data  Subjective: pt hesitant to participate in therapy; requires max encouragement  Patient Stated Goal: feel better   Cognition  Cognition Arousal/Alertness: Awake/alert Behavior During Therapy: Anxious Overall Cognitive Status: Within Functional Limits for tasks assessed    Balance  Balance Balance Assessed: No  End of Session PT - End of Session Equipment Utilized During Treatment: Gait belt;Left knee immobilizer Activity Tolerance: Patient tolerated treatment well Patient left: in chair;with call bell/phone within reach Nurse Communication: Mobility status   GP     Donell Sievert, [place] [phone] 06/19/2013, 1:24 PM

## 2013-06-19 NOTE — Evaluation (Signed)
Occupational Therapy Evaluation and Discharge Patient Details Name: Danielle Schmitt MRN: [id number] DOB: [birth year] Today's Date: 06/19/2013 Time: 4098-1191 OT Time Calculation (min): 21 min  OT Assessment / Plan / Recommendation History of present illness s/p posterior L THA POD#1.   Clinical Impression   Pt evaled today. Fatigue due to 8.4 Hgb today even after transfusion yesterday. Lives alone and needs continued OT to get back to an I/Mod I level. Pt has all needed DME at home except for tub seat (which I explained to her that they should address with her at the SNF)    OT Assessment  Patient needs continued OT Services    Follow Up Recommendations  SNF    Barriers to Discharge Decreased caregiver support    Equipment Recommendations  None recommended by OT          Precautions / Restrictions Precautions Precautions: Posterior Hip;Fall Precaution Comments: educated on posterior hip precautions; able to recall 3/3 at end of session  Required Braces or Orthoses: Knee Immobilizer - Left Restrictions Weight Bearing Restrictions: No LLE Weight Bearing: Weight bearing as tolerated   Pertinent Vitals/Pain 5/10 right hip--repositioned    ADL  Eating/Feeding: Independent Where Assessed - Eating/Feeding: Bed level Grooming: Set up Where Assessed - Grooming: Unsupported sitting Upper Body Bathing: Set up Where Assessed - Upper Body Bathing: Unsupported sitting Lower Body Bathing: Maximal assistance Where Assessed - Lower Body Bathing: Supported sit to stand Upper Body Dressing: Set up Where Assessed - Upper Body Dressing: Unsupported sitting Lower Body Dressing: +1 Total assistance Where Assessed - Lower Body Dressing: Supported sit to Pharmacist, hospital: Minimal assistance Toilet Transfer Method: Sit to Barista: Bedside commode Toileting - Clothing Manipulation and Hygiene: Minimal assistance Where Assessed - Engineer, mining and  Hygiene: Standing Equipment Used: Rolling walker Transfers/Ambulation Related to ADLs: Min A for all    OT Diagnosis: Acute pain  OT Problem List: Decreased strength;Decreased range of motion;Decreased activity tolerance;Impaired balance (sitting and/or standing);Pain;Decreased knowledge of precautions       Visit Information  Last OT Received On: 06/19/13 Assistance Needed: +1 History of Present Illness: s/p posterior L THA POD#1.       Prior Functioning     Home Living Family/patient expects to be discharged to:: Skilled nursing facility Prior Function Level of Independence: Independent with assistive device(s) Communication Communication: No difficulties Dominant Hand: Right            Cognition  Cognition Arousal/Alertness: Awake/alert Behavior During Therapy: WFL for tasks assessed/performed Overall Cognitive Status: Within Functional Limits for tasks assessed    Extremity/Trunk Assessment Upper Extremity Assessment Upper Extremity Assessment: Overall WFL for tasks assessed     Mobility Bed Mobility Bed Mobility: Supine to Sit;Sitting - Scoot to Edge of Bed Supine to Sit: 4: Min assist;HOB elevated;With rails Sitting - Scoot to Edge of Bed: 4: Min assist Details for Bed Mobility Assistance: A for RLE only Transfers Transfers: Sit to Stand;Stand to Sit Sit to Stand: 4: Min assist;With upper extremity assist;From bed Stand to Sit: 4: Min assist;With upper extremity assist;With armrests;To chair/3-in-1 Details for Transfer Assistance: VCs for leg position and hand placement           End of Session OT - End of Session Activity Tolerance: Patient limited by fatigue (Hgb is low again today, even after transfusion) Patient left: in chair;with call bell/phone within reach Nurse Communication:  (Pt up in recliner)       Evette Georges [phone]  06/19/2013, 9:49 AM

## 2013-06-20 ENCOUNTER — Encounter (HOSPITAL_COMMUNITY): Payer: Self-pay | Admitting: Orthopaedic Surgery

## 2013-06-20 LAB — CBC
MCH: 30 pg (ref 26.0–34.0)
MCHC: 34.6 g/dL (ref 30.0–36.0)
Platelets: 230 10*3/uL (ref 150–400)
Platelets: 237 10*3/uL (ref 150–400)
RBC: 3.29 MIL/uL — ABNORMAL LOW (ref 3.87–5.11)
RBC: 3.3 MIL/uL — ABNORMAL LOW (ref 3.87–5.11)
WBC: 8.8 10*3/uL (ref 4.0–10.5)

## 2013-06-20 LAB — BASIC METABOLIC PANEL
CO2: 27 mEq/L (ref 19–32)
Calcium: 8.4 mg/dL (ref 8.4–10.5)
GFR calc Af Amer: >90 mL/min (ref >90)
GFR calc non Af Amer: >90 mL/min (ref >90)
Sodium: 137 mEq/L (ref 135–145)

## 2013-06-20 LAB — TYPE AND SCREEN: Unit division: 0

## 2013-06-20 MED ORDER — RIVAROXABAN 10 MG PO TABS: 10.0000 mg | ORAL_TABLET | Freq: Every day | ORAL | Status: DC

## 2013-06-20 MED ORDER — ACETAMINOPHEN 325 MG PO TABS: 650.0000 mg | ORAL_TABLET | ORAL | Status: DC | PRN

## 2013-06-20 MED ORDER — DSS 100 MG PO CAPS: 100.0000 mg | ORAL_CAPSULE | Freq: Two times a day (BID) | ORAL | Status: DC

## 2013-06-20 MED ORDER — METHOCARBAMOL 500 MG PO TABS: 500.0000 mg | ORAL_TABLET | Freq: Four times a day (QID) | ORAL | Status: DC | PRN

## 2013-06-20 MED ORDER — OXYCODONE HCL 5 MG PO TABS: 5.0000 mg | ORAL_TABLET | ORAL | Status: DC | PRN

## 2013-06-20 NOTE — Progress Notes (Signed)
Clinical social worker assisted with patient discharge to skilled nursing facility, [facility] and [place].  CSW addressed all family questions and concerns. CSW copied chart and added all important documents. CSW also set up patient transportation with [employer]. Clinical Social Worker will sign off for now as social work intervention is no longer needed.  Sabino Niemann, MSW, [phone]

## 2013-06-20 NOTE — Progress Notes (Signed)
Patient ID: Danielle Schmitt, female   DOB: 25-Sep-1956, 57 y.o.   MRN: [id number] PATIENT ID: Danielle Schmitt        MRN:  [id number]          DOB/AGE: 1956/01/31 / 57 y.o.    Danielle Campbell, MD   Danielle Code, PA-C [address] [place], [place]  [address]                             [phone]   PROGRESS NOTE  Subjective:  negative for Chest Pain  negative for Shortness of Breath except when going to bathroom  negative for Nausea/Vomiting   negative for Calf Pain    Tolerating Diet: yes         Patient reports pain as mild and moderate.     Feels better.  Objective: Vital signs in last 24 hours:   Patient Vitals for the past 24 hrs:  BP Temp Temp src Pulse Resp SpO2  06/20/13 0529 117/60 mmHg 99.1 F (37.3 C) - 90 16 98 %  06/19/13 2200 109/58 mmHg 98.7 F (37.1 C) - 94 16 98 %  06/19/13 2020 123/62 mmHg 99.9 F (37.7 C) Oral 94 20 -  06/19/13 2005 124/65 mmHg 99.9 F (37.7 C) Oral 95 18 -  06/19/13 2000 - - - - 18 -  06/19/13 1905 138/73 mmHg 99.5 F (37.5 C) Oral 97 20 -  06/19/13 1805 124/65 mmHg 99 F (37.2 C) Oral 95 16 -  06/19/13 1730 118/56 mmHg 99 F (37.2 C) Oral 102 18 -  06/19/13 1645 114/64 mmHg 98.9 F (37.2 C) Oral 97 20 -  06/19/13 1600 - - - - 16 98 %  06/19/13 1545 117/55 mmHg 98.8 F (37.1 C) Oral 98 18 -  06/19/13 1445 124/65 mmHg 99.2 F (37.3 C) Oral 101 18 -  06/19/13 1412 115/61 mmHg 99.2 F (37.3 C) Oral 105 16 -  06/19/13 1300 112/91 mmHg 98.6 F (37 C) - 111 18 96 %  06/19/13 1200 - - - - 18 96 %  06/19/13 0800 - - - - 16 93 %      Intake/Output from previous day:   06/26 0701 - 06/27 0700 In: 950 [I.V.:250] Out: -    Intake/Output this shift:       Intake/Output     06/26 0701 - 06/27 0700 06/27 0701 - 06/28 0700   P.O.     I.V. 250    Blood 700    Total Intake 950     Urine     Total Output       Net +950          Urine Occurrence 3 x       LABORATORY DATA:  Recent Labs  06/18/13 0500 06/18/13 1540  06/19/13 0445 06/20/13 0020 06/20/13 0428  WBC 7.1 9.8 8.9 8.8 7.9  HGB 8.4* 9.3* 8.4* 9.8* 9.9*  HCT 24.0* 26.6* 24.0* 28.5* 28.6*  PLT 266 266 238 230 237    Recent Labs  06/18/13 0500 06/19/13 0445 06/20/13 0428  NA 133* 136 137  K 3.7 4.0 3.9  CL 100 102 102  CO2 26 29 27   BUN 16 14 10   CREATININE 0.60 0.61 0.45*  GLUCOSE 128* 123* 111*  CALCIUM 7.8* 8.2* 8.4   Lab Results  Component Value Date   INR 0.92 06/11/2013   INR 0.95 01/10/2010    Recent  Radiographic Studies :  Dg Chest 2 View  06/18/2013   *RADIOLOGY REPORT*  Clinical Data: Persistent postoperative elevated temperature.  CHEST - 2 VIEW  Comparison: CT 07/11/2011.  Findings: Chronic elevation of the right hemidiaphragm which appears unchanged compared to CT scout images from 07/11/2011. There is no airspace disease.  No pleural effusion. Cardiopericardial silhouette and mediastinal contours are within normal limits.  IMPRESSION: No active cardiopulmonary disease.  Chronic elevation of the right hemidiaphragm.   Original Report Authenticated By: Andreas Newport, M.D.   Dg Tibia/fibula Left  06/18/2013   *RADIOLOGY REPORT*  Clinical Data: 57 year old female status post recent the left hip arthroplasty.  Left lower extremity pain.  LEFT TIBIA AND FIBULA - 2 VIEW  Comparison: Knee series from the same day.  Intraoperative left hip radiographs 06/17/2013.  Findings: Sequelae of left knee arthroplasty partially re- identified and stable. Globular density at the proximal left tibia diaphysis medially appears to represent bone cement.  Left tibia and fibula intact.  Grossly normal alignment at the left ankle. Degenerative spurring at the medial, lateral, and posterior malleoli.  IMPRESSION: No acute osseous abnormality identified about the left tib-fib.   Original Report Authenticated By: Erskine Speed, M.D.   Dg Pelvis Portable  06/17/2013   *RADIOLOGY REPORT*  Clinical Data: Postoperative evaluation.  PORTABLE PELVIS   Comparison: Previous left hip image of same date.  Findings: Left hip arthroplasty has been performed.  There is expected relationship between femoral and acetabular components. No dislocation or disruption of hardware is evident.  IMPRESSION: Left hip arthroplasty has been performed.  No dislocation or disruption of hardware is evident.   Original Report Authenticated By: Onalee Hua Call   Dg Hip Portable 1 View Left  06/17/2013   *RADIOLOGY REPORT*  Clinical Data: Postoperative evaluation.  Post left hip arthroplasty.  PORTABLE LEFT HIP - 1 VIEW  Comparison: None.  Findings: Portable cross-table lateral image is submitted.  Left hip arthroplasty has been performed.  There is expected relationship between acetabular and femoral components.  No dislocation or disruption of hardware is evident.  IMPRESSION: Post left hip arthroplasty.  No dislocation or disruption of hardware is evident.   Original Report Authenticated By: Onalee Hua Call   Dg Knee 4 Views W/patella Left  06/18/2013   *RADIOLOGY REPORT*  Clinical Data: Status post left hip replacement  LEFT KNEE - COMPLETE 4+ VIEW  Comparison: None.  Findings: The patient is status post tricompartmental knee replacement.  No fracture.  No subluxation or dislocation.  No evidence for hardware complications.  IMPRESSION: Status post tricompartmental knee replacement without evidence for complicating features.   Original Report Authenticated By: Kennith Center, M.D.     Examination:  General appearance: alert, mild distress and moderate distress Resp: clear to auscultation bilaterally Cardio: regular rate and rhythm GI: normal findings: bowel sounds normal  Wound Exam: clean, dry, intact   Drainage:  Scant/small amount Serosanguinous exudate  Motor Exam: EHL, FHL, Anterior Tibial and Posterior Tibial Intact  Sensory Exam: Superficial Peroneal, Deep Peroneal and Tibial normal  Vascular Exam: Left dorsalis pedis artery has 1+ (weak) pulse  Assessment:     3 Days Post-Op  Procedure(s) (LRB): LEFT TOTAL HIP ARTHROPLASTY (Left)  ADDITIONAL DIAGNOSIS:  Principal Problem:   Osteoarthritis of left hip Active Problems:   Morbid obesity   Asthma   Postoperative anemia due to acute blood loss  Acute Blood Loss Anemia asymptomatic after blood and Hyponatremia resolved   Plan: Physical Therapy as ordered Weight Bearing as  Tolerated (WBAT)  DVT Prophylaxis:  Xarelto, Foot Pumps and TED hose  DISCHARGE PLAN: Skilled Nursing Facility/Rehab  DISCHARGE NEEDS: HHPT, Walker and 3-in-1 comode seat         Emmilynn Marut 06/20/2013 7:55 AM

## 2013-06-20 NOTE — Discharge Summary (Signed)
Danielle Campbell, MD   Danielle Code, PA-C [address] [place], [place]  [address]                             [phone]  PATIENT ID: Danielle Schmitt        MRN:  [id number]          DOB/AGE: [birth year] / 57 y.o.    DISCHARGE SUMMARY  ADMISSION DATE:    06/17/2013 DISCHARGE DATE:   06/20/2013   ADMISSION DIAGNOSIS: Left Hip Osteoarthritis    DISCHARGE DIAGNOSIS:  Left Hip Osteoarthritis    ADDITIONAL DIAGNOSIS: Principal Problem:   Osteoarthritis of left hip Active Problems:   Morbid obesity   Asthma   Postoperative anemia due to acute blood loss  Past Medical History  Diagnosis Date  . Complication of anesthesia     Hx: of bronchospasm attack during procedure  . Asthma   . Environmental allergies     Hx : of  . Degenerative joint disease   . Hypertension   . Spinal headache     ;Hx: of headache with Epidural; migraines  . Anemia   . 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 She has been seen previously, and there was a suggestion of the fact that she needs a left total hip replacement but was trying to treat this conservatively through a pain control center. She, however, has been noted to be now using oxycodone and Tylenol at 7.5/325 mg 1 every 6 hours to control her pain and discomfort. It has worsened now to the point where she really must use a walker to really get around. She has pain at nighttime as well as pain with activities. She has difficulty doing her activities of daily living. It hurts to sit for prolonged periods of time. She states her pain is a throbbing, aching pain with occasional sharpness especially if her foot gets into the wrong position and the hip twists at all. She denies any neurovascular compromise. Denies any radicular-type pain at  this time.    HOSPITAL COURSE:  Danielle Schmitt is a 57 y.o. admitted on 06/17/2013 and found to have a diagnosis of Left Hip Osteoarthritis.  After appropriate laboratory studies were obtained  they were taken to the operating room on 06/17/2013 and underwent  Procedure(s): LEFT TOTAL HIP ARTHROPLASTY     They were given perioperative antibiotics:  Anti-infectives   Start     Dose/Rate Route Frequency Ordered Stop   06/18/13 1000  hydroxychloroquine (PLAQUENIL) tablet 200 mg     200 mg Oral Daily 06/17/13 1553     06/17/13 1600  ceFAZolin (ANCEF) IVPB 2 g/50 mL premix     2 g 100 mL/hr over 30 Minutes Intravenous Every 6 hours 06/17/13 1553 06/17/13 2245   06/17/13 0600  ceFAZolin (ANCEF) 3 g in dextrose 5 % 50 mL IVPB     3 g 160 mL/hr over 30 Minutes Intravenous On call to O.R. 06/16/13 1342 06/17/13 0724    .  Tolerated the procedure well.  Placed with a foley intraoperatively.    Toradol was given post op.  POD #1, allowed out of bed to a chair.  PT for ambulation  and exercise program.  Foley D/C'd in morning.  IV saline locked.  O2 discontionued. Had problems with hypotension and tachycardia.  Gave one unit of blood.  POD #2, continued PT and ambulation but still was very weak and dizzy.  Did not do well with PT.  Gave 2 more units of blood. Marland Kitchen POD #3, improved.  Plan d/c to [facility] today The remainder of the hospital course was dedicated to ambulation and strengthening.   The patient was discharged on 3 Days Post-Op in  Stable condition.  Blood products given:3 units CC PRBC  DIAGNOSTIC STUDIES: Recent vital signs:  Patient Vitals for the past 24 hrs:  BP Temp Temp src Pulse Resp SpO2  06/20/13 0810 - - - - 18 92 %  06/20/13 0529 117/60 mmHg 99.1 F (37.3 C) - 90 16 98 %  06/19/13 2200 109/58 mmHg 98.7 F (37.1 C) - 94 16 98 %  06/19/13 2020 123/62 mmHg 99.9 F (37.7 C) Oral 94 20 -  06/19/13 2005 124/65 mmHg 99.9 F (37.7 C) Oral 95 18 -  06/19/13 2000 - - - - 18 -    06/19/13 1905 138/73 mmHg 99.5 F (37.5 C) Oral 97 20 -  06/19/13 1805 124/65 mmHg 99 F (37.2 C) Oral 95 16 -  06/19/13 1730 118/56 mmHg 99 F (37.2 C) Oral 102 18 -  06/19/13 1645 114/64 mmHg 98.9 F (37.2 C) Oral 97 20 -  06/19/13 1600 - - - - 16 98 %  06/19/13 1545 117/55 mmHg 98.8 F (37.1 C) Oral 98 18 -  06/19/13 1445 124/65 mmHg 99.2 F (37.3 C) Oral 101 18 -  06/19/13 1412 115/61 mmHg 99.2 F (37.3 C) Oral 105 16 -  06/19/13 1300 112/91 mmHg 98.6 F (37 C) - 111 18 96 %  06/19/13 1200 - - - - 18 96 %       Recent laboratory studies:  Recent Labs  06/18/13 0500 06/18/13 1540 06/19/13 0445 06/20/13 0020 06/20/13 0428  WBC 7.1 9.8 8.9 8.8 7.9  HGB 8.4* 9.3* 8.4* 9.8* 9.9*  HCT 24.0* 26.6* 24.0* 28.5* 28.6*  PLT 266 266 238 230 237    Recent Labs  06/18/13 0500 06/19/13 0445 06/20/13 0428  NA 133* 136 137  K 3.7 4.0 3.9  CL 100 102 102  CO2 26 29 27   BUN 16 14 10   CREATININE 0.60 0.61 0.45*  GLUCOSE 128* 123* 111*  CALCIUM 7.8* 8.2* 8.4   Lab Results  Component Value Date   INR 0.92 06/11/2013   INR 0.95 01/10/2010     Recent Radiographic Studies :  Dg Chest 2 View  06/18/2013   *RADIOLOGY REPORT*  Clinical Data: Persistent postoperative elevated temperature.  CHEST - 2 VIEW  Comparison: CT 07/11/2011.  Findings: Chronic elevation of the right hemidiaphragm which appears unchanged compared to CT scout images from 07/11/2011. There is no airspace disease.  No pleural effusion. Cardiopericardial silhouette and mediastinal contours are within normal limits.  IMPRESSION: No active cardiopulmonary disease.  Chronic elevation of the right hemidiaphragm.   Original Report Authenticated By: Andreas Newport, M.D.   Dg Tibia/fibula Left  06/18/2013   *RADIOLOGY REPORT*  Clinical Data: 57 year old female status post recent the left hip arthroplasty.  Left lower extremity pain.  LEFT TIBIA AND FIBULA - 2 VIEW  Comparison: Knee series from the same day.   Intraoperative left hip radiographs 06/17/2013.  Findings: Sequelae of left knee arthroplasty partially re- identified and stable. Globular density  at the proximal left tibia diaphysis medially appears to represent bone cement.  Left tibia and fibula intact.  Grossly normal alignment at the left ankle. Degenerative spurring at the medial, lateral, and posterior malleoli.  IMPRESSION: No acute osseous abnormality identified about the left tib-fib.   Original Report Authenticated By: Erskine Speed, M.D.   Dg Pelvis Portable  06/17/2013   *RADIOLOGY REPORT*  Clinical Data: Postoperative evaluation.  PORTABLE PELVIS  Comparison: Previous left hip image of same date.  Findings: Left hip arthroplasty has been performed.  There is expected relationship between femoral and acetabular components. No dislocation or disruption of hardware is evident.  IMPRESSION: Left hip arthroplasty has been performed.  No dislocation or disruption of hardware is evident.   Original Report Authenticated By: Onalee Hua Call   Dg Hip Portable 1 View Left  06/17/2013   *RADIOLOGY REPORT*  Clinical Data: Postoperative evaluation.  Post left hip arthroplasty.  PORTABLE LEFT HIP - 1 VIEW  Comparison: None.  Findings: Portable cross-table lateral image is submitted.  Left hip arthroplasty has been performed.  There is expected relationship between acetabular and femoral components.  No dislocation or disruption of hardware is evident.  IMPRESSION: Post left hip arthroplasty.  No dislocation or disruption of hardware is evident.   Original Report Authenticated By: Onalee Hua Call   Dg Knee 4 Views W/patella Left  06/18/2013   *RADIOLOGY REPORT*  Clinical Data: Status post left hip replacement  LEFT KNEE - COMPLETE 4+ VIEW  Comparison: None.  Findings: The patient is status post tricompartmental knee replacement.  No fracture.  No subluxation or dislocation.  No evidence for hardware complications.  IMPRESSION: Status post tricompartmental knee  replacement without evidence for complicating features.   Original Report Authenticated By: Kennith Center, M.D.    DISCHARGE INSTRUCTIONS:     Discharge Orders   Future Orders Complete By Expires     Call MD / Call 911  As directed     Comments:      If you experience chest pain or shortness of breath, CALL 911 and be transported to the hospital emergency room.  If you develope a fever above 101 F, pus (white drainage) or increased drainage or redness at the wound, or calf pain, call your surgeon's office.    Change dressing  As directed     Comments:      You may change your dressing on Saturday, then change the dressing daily with sterile 4 x 4 inch gauze dressing and paper tape.  You may clean the incision with alcohol prior to redressing    Constipation Prevention  As directed     Comments:      Drink plenty of fluids.  Prune juice and/or coffee may be helpful.  You may use a stool softener, such as Colace (over the counter) 100 mg twice a day.  Use MiraLax (over the counter) for constipation as needed but this may take several days to work.  Mag Citrate --OR-- Milk of Magnesia may also be used but follow directions on the label.    Diet general  As directed     Driving restrictions  As directed     Comments:      No driving for 6 weeks    Follow the hip precautions as taught in Physical Therapy  As directed     Increase activity slowly as tolerated  As directed     Lifting restrictions  As directed     Comments:  No lifting for 6 weeks    Patient may shower  As directed     Comments:      You may shower over the brown dressing.  Once the dressing is removed you may shower without a dressing once there is no drainage.  Do not wash over the wound.  If drainage remains, cover wound with plastic wrap and then shower.    TED hose  As directed     Comments:      Use stockings (TED hose) for 1-2 weeks on operative leg(s).  You may remove them at night for sleeping. May stop the  NON-operative leg stocking when you go home.    Weight bearing as tolerated  As directed        DISCHARGE MEDICATIONS:     Medication List    STOP taking these medications       celecoxib 200 MG capsule  Commonly known as:  CELEBREX     oxyCODONE-acetaminophen 7.5-325 MG per tablet  Commonly known as:  PERCOCET      TAKE these medications       acetaminophen 325 MG tablet  Commonly known as:  TYLENOL  Take 2 tablets (650 mg total) by mouth every 4 (four) hours as needed.     albuterol 108 (90 BASE) MCG/ACT inhaler  Commonly known as:  PROVENTIL HFA;VENTOLIN HFA  Inhale 1 puff into the lungs every 6 (six) hours as needed for wheezing.     amphetamine-dextroamphetamine 20 MG tablet  Commonly known as:  ADDERALL  Take 20 mg by mouth 2 (two) times daily as needed. Only takes at while at work     CALCIUM 500 PO  Take 2 tablets by mouth daily.     cholecalciferol 1000 UNITS tablet  Commonly known as:  VITAMIN D  Take 1,000 Units by mouth daily.     DSS 100 MG Caps  Take 100 mg by mouth 2 (two) times daily.     DULoxetine 60 MG capsule  Commonly known as:  CYMBALTA  Take 60 mg by mouth daily.     ergocalciferol 50000 UNITS capsule  Commonly known as:  VITAMIN D2  Take 50,000 Units by mouth 2 (two) times a week.     flintstones complete 60 MG chewable tablet  Chew 1 tablet by mouth daily.     hydroxychloroquine 200 MG tablet  Commonly known as:  PLAQUENIL  Take by mouth daily.     lisinopril-hydrochlorothiazide 20-25 MG per tablet  Commonly known as:  PRINZIDE,ZESTORETIC  Take 1 tablet by mouth daily.     loratadine 10 MG tablet  Commonly known as:  CLARITIN  Take 10 mg by mouth daily.     methocarbamol 500 MG tablet  Commonly known as:  ROBAXIN  Take 1 tablet (500 mg total) by mouth every 6 (six) hours as needed.     mometasone 50 MCG/ACT nasal spray  Commonly known as:  NASONEX  Place 2 sprays into the nose 2 (two) times daily.     montelukast 10 MG  tablet  Commonly known as:  SINGULAIR  Take 10 mg by mouth at bedtime.     OVER THE COUNTER MEDICATION  Take 1 tablet by mouth daily. [facility] Easy Fiber     oxyCODONE 5 MG immediate release tablet  Commonly known as:  Oxy IR/ROXICODONE  Take 1-2 tablets (5-10 mg total) by mouth every 3 (three) hours as needed.     rivaroxaban 10 MG Tabs tablet  Commonly known as:  XARELTO  Take 1 tablet (10 mg total) by mouth at bedtime.     VITAMIN B1-B12 IM  Inject 1 application into the muscle every 30 (thirty) days.        FOLLOW UP VISIT:   Follow-up Information   Follow up with [facility], Claude Manges, MD. Schedule an appointment as soon as possible for a visit on 07/02/2013.   Contact information:   [address] [place] [address] [phone]       DISPOSITION:   Skilled Nursing Facility/Rehab  CONDITION:  Stable   PETRARCA,BRIAN 06/20/2013, 8:17 AM

## 2017-09-12 ENCOUNTER — Encounter (INDEPENDENT_AMBULATORY_CARE_PROVIDER_SITE_OTHER): Payer: Self-pay | Admitting: Orthopaedic Surgery

## 2017-09-12 ENCOUNTER — Ambulatory Visit (INDEPENDENT_AMBULATORY_CARE_PROVIDER_SITE_OTHER): Payer: BLUE CROSS/BLUE SHIELD | Admitting: Orthopaedic Surgery

## 2017-09-12 ENCOUNTER — Telehealth (INDEPENDENT_AMBULATORY_CARE_PROVIDER_SITE_OTHER): Payer: Self-pay | Admitting: Orthopaedic Surgery

## 2017-09-12 VITALS — BP 127/74 | HR 80 | Resp 14 | Ht 63.0 in | Wt 220.0 lb

## 2017-09-12 DIAGNOSIS — M1711 Unilateral primary osteoarthritis, right knee: Secondary | ICD-10-CM | POA: Diagnosis not present

## 2017-09-12 NOTE — Telephone Encounter (Signed)
GPs office calling requesting a call back concerning patients clearance for surgery. GP wanting to know if physical in May is sufficit for clearence. (patient told doctor this would be okay) Please call to advise. Also, advise if we will be doing EKG, and CXR before surgery.

## 2017-09-12 NOTE — Progress Notes (Signed)
Office Visit Note   Patient: Danielle Schmitt           Date of Birth: 16-Jun-1956           MRN: [id number] Visit Date: 09/12/2017              Requested by: Donnita Falls, MD [address] [place], [place] [address] PCP: Donnita Falls, MD   Assessment & Plan: Visit Diagnoses:  1. Unilateral primary osteoarthritis, right knee   end-stage  Osteoarthritis right knee  Plan: long discussion regarding diagnosis and treatment options.Danielle Schmitt would like to proceed with a right total knee replacement. Discussed hospitalization, incision, rehabilitation. She's had a prior left total knee replacement and is somewhat familiar with the procedure.we'll provide clearance forms and schedule surgery as soon as possible.  Follow-Up Instructions: Return will schedule knee replacement.   Orders:  No orders of the defined types were placed in this encounter.  No orders of the defined types were placed in this encounter.     Procedures: No procedures performed   Clinical Data: No additional findings.   Subjective: Chief Complaint  Patient presents with  . Right Knee - Pain, Edema    Danielle Schmitt is a 61 y o that presents with chonic Right knee pain. End stage OA Pain management.   Danielle Schmitt is at a  point where she wishes to discuss right total knee replacement. She's been having trouble with her right knee for "many years". She's been followed in [place] with injections. She's tried using anti-inflammatory medicines and muscle relaxants. She also is been followed in a pain clinic presently taking tramadol at night. She has multiple joint complaints. She's had a prior left total hip replacement and left total knee replacement both of which are doing well. She's having considerable pain to the point of compromise with her right knee. Films were performed in May which I reviewed on a disc. She has approximately 45 of varus with complete collapse of bone in the medial compartment and multiple  osteophytes. She's also had a prior right left total knee replacement  by Dr. Priscille Kluver which appears to be in excellent position .she's having compromise of her activities of daily living and sleep and wishes to proceedwith a right knee replacement.history of prior right knee surgery by Dr. Renae Fickle many years ago for some type of instability.Does have history of chronic back pain with prior surgery by Dr. Trey Sailors. HPI  Review of Systems  Constitutional: Negative for chills, fatigue and fever.  Eyes: Negative for itching.  Respiratory: Negative for chest tightness and shortness of breath.   Cardiovascular: Positive for leg swelling. Negative for palpitations.  Gastrointestinal: Negative for blood in stool, constipation and diarrhea.  Endocrine: Negative for polyuria.  Genitourinary: Negative for dysuria.  Musculoskeletal: Negative for back pain, joint swelling, neck pain and neck stiffness.  Allergic/Immunologic: Negative for immunocompromised state.  Neurological: Positive for light-headedness. Negative for dizziness and numbness.  Hematological: Does not bruise/bleed easily.  Psychiatric/Behavioral: Positive for sleep disturbance. The patient is not nervous/anxious.      Objective: Vital Signs: BP 127/74   Pulse 80   Resp 14   Ht  (1.6 m)   Wt 220 lb (99.8 kg)   BMI 38.97 kg/m   Physical Exam  Ortho Examawake alert and oriented 3. Comfortable sitting position. Prior history of asthma but presently without shortness of breath or chest pain. She does walk with a limp referable to her right knee.  Full extension and flexed approximately 110. She does have positive patellar crepitation with pain as well as medialjoint pain and varus with weightbearing No calf pain.No distal edema. No obvious effusion right knee. No instability.straight leg raise negative bilaterally. Painless range of motion both hips. Skin intact. Left total knee replacement without pain. No knee effusion. Full  extension and flexion over 105 without instability. No pain with range of motion or weightbearing of left hip replacement her vascular exam intact. Specialty Comments:  No specialty comments available.  Imaging: No results found.   PMFS History: Patient Active Problem List   Diagnosis Date Noted  . Postoperative anemia due to acute blood loss 06/19/2013  . Osteoarthritis of left hip 06/17/2013  . Morbid obesity (HCC) 06/17/2013  . Asthma 06/17/2013   Past Medical History:  Diagnosis Date  . Anemia   . Asthma   . B12 deficiency    Hx: of  . Complication of anesthesia    Hx: of bronchospasm attack during procedure  . Degenerative joint disease   . Environmental allergies    Hx : of  . Hypertension   . Spinal headache    ;Hx: of headache with Epidural; migraines    Family History  Problem Relation Age of Onset  . Cancer - Lung Mother   . Hypertension Mother   . Hypertension Father   . Kidney failure Father   . Hypertension Brother     Past Surgical History:  Procedure Laterality Date  . ABDOMINAL HYSTERECTOMY    . BACK SURGERY     Hx: of cyst removal  . CARPAL TUNNEL RELEASE     Hx: of B/L wrists  . COLONOSCOPY W/ BIOPSIES AND POLYPECTOMY     Hx: of  . Left knee surgery     Hx; of  . Right knee surgery     Hx: of  . right shoulder     Hx: of roatator cuff repair  . TONSILLECTOMY    . TOTAL HIP ARTHROPLASTY Left 06/17/2013   Procedure: LEFT TOTAL HIP ARTHROPLASTY;  Surgeon: Valeria Batman, MD;  Location: [facility];  Service: Orthopedics;  Laterality: Left;   Social History   Occupational History  . Not on file.   Social History Main Topics  . Smoking status: Never Smoker  . Smokeless tobacco: Never Used  . Alcohol use Not on file     Comment: once a year  . Drug use: No  . Sexual activity: Not Currently

## 2017-09-13 NOTE — Telephone Encounter (Signed)
Need doctors name and telephone number

## 2017-09-13 NOTE — Telephone Encounter (Signed)
Please advise 

## 2017-09-13 NOTE — Telephone Encounter (Signed)
Spoke with [name] and answered questions

## 2017-09-21 ENCOUNTER — Encounter (INDEPENDENT_AMBULATORY_CARE_PROVIDER_SITE_OTHER): Payer: Self-pay | Admitting: Orthopaedic Surgery

## 2017-09-21 ENCOUNTER — Telehealth (INDEPENDENT_AMBULATORY_CARE_PROVIDER_SITE_OTHER): Payer: Self-pay | Admitting: Orthopaedic Surgery

## 2017-09-21 NOTE — Telephone Encounter (Signed)
Lvm with pt to please call to schedule surgery. Will try pt again at a later time.

## 2017-10-10 ENCOUNTER — Encounter (INDEPENDENT_AMBULATORY_CARE_PROVIDER_SITE_OTHER): Payer: Self-pay

## 2017-10-17 ENCOUNTER — Ambulatory Visit (INDEPENDENT_AMBULATORY_CARE_PROVIDER_SITE_OTHER): Payer: BLUE CROSS/BLUE SHIELD | Admitting: Orthopedic Surgery

## 2017-10-18 ENCOUNTER — Encounter (HOSPITAL_COMMUNITY): Payer: Self-pay

## 2017-10-19 ENCOUNTER — Encounter (HOSPITAL_COMMUNITY): Payer: Self-pay

## 2017-10-19 ENCOUNTER — Encounter (HOSPITAL_COMMUNITY)
Admission: RE | Admit: 2017-10-19 | Discharge: 2017-10-19 | Disposition: A | Discharge status: Home / Self Care | Payer: BLUE CROSS/BLUE SHIELD | Source: Ambulatory Visit | Attending: Orthopaedic Surgery | Admitting: Orthopaedic Surgery

## 2017-10-19 ENCOUNTER — Ambulatory Visit (HOSPITAL_COMMUNITY)
Admission: RE | Admit: 2017-10-19 | Discharge: 2017-10-19 | Disposition: A | Discharge status: Home / Self Care | Payer: BLUE CROSS/BLUE SHIELD | Source: Ambulatory Visit | Attending: Orthopedic Surgery | Admitting: Orthopedic Surgery

## 2017-10-19 DIAGNOSIS — R918 Other nonspecific abnormal finding of lung field: Secondary | ICD-10-CM | POA: Diagnosis not present

## 2017-10-19 DIAGNOSIS — J45909 Unspecified asthma, uncomplicated: Secondary | ICD-10-CM | POA: Insufficient documentation

## 2017-10-19 DIAGNOSIS — Z01810 Encounter for preprocedural cardiovascular examination: Secondary | ICD-10-CM | POA: Insufficient documentation

## 2017-10-19 DIAGNOSIS — Z01818 Encounter for other preprocedural examination: Secondary | ICD-10-CM | POA: Diagnosis not present

## 2017-10-19 DIAGNOSIS — Z01812 Encounter for preprocedural laboratory examination: Secondary | ICD-10-CM | POA: Insufficient documentation

## 2017-10-19 HISTORY — DX: Unspecified urinary incontinence: R32

## 2017-10-19 HISTORY — DX: Personal history of other medical treatment: Z92.89

## 2017-10-19 HISTORY — DX: Major depressive disorder, single episode, unspecified: F32.9

## 2017-10-19 HISTORY — DX: Depression, unspecified: F32.A

## 2017-10-19 HISTORY — DX: Pneumonia, unspecified organism: J18.9

## 2017-10-19 LAB — COMPREHENSIVE METABOLIC PANEL
ALT: 25 U/L (ref 14–54)
ANION GAP: 13 (ref 5–15)
AST: 26 U/L (ref 15–41)
Albumin: 4.5 g/dL (ref 3.5–5.0)
Alkaline Phosphatase: 85 U/L (ref 38–126)
BILIRUBIN TOTAL: 0.7 mg/dL (ref 0.3–1.2)
BUN: 11 mg/dL (ref 6–20)
CALCIUM: 9.6 mg/dL (ref 8.9–10.3)
CO2: 25 mmol/L (ref 22–32)
Chloride: 97 mmol/L — ABNORMAL LOW (ref 101–111)
Creatinine, Ser: 0.64 mg/dL (ref 0.44–1.00)
GFR calc non Af Amer: >60 mL/min (ref >60)
GLUCOSE: 96 mg/dL (ref 65–99)
POTASSIUM: 3.8 mmol/L (ref 3.5–5.1)
SODIUM: 135 mmol/L (ref 135–145)
TOTAL PROTEIN: 7.8 g/dL (ref 6.5–8.1)

## 2017-10-19 LAB — URINALYSIS, ROUTINE W REFLEX MICROSCOPIC
Bilirubin Urine: NEGATIVE
Glucose, UA: NEGATIVE mg/dL
HGB URINE DIPSTICK: NEGATIVE
Ketones, ur: 20 mg/dL — AB
Nitrite: NEGATIVE
PROTEIN: NEGATIVE mg/dL
SPECIFIC GRAVITY, URINE: 1.01 (ref 1.005–1.030)
pH: 6 (ref 5.0–8.0)

## 2017-10-19 LAB — CBC WITH DIFFERENTIAL/PLATELET
BASOS PCT: 0 %
Basophils Absolute: 0 10*3/uL (ref 0.0–0.1)
EOS ABS: 0.3 10*3/uL (ref 0.0–0.7)
Eosinophils Relative: 3 %
HEMATOCRIT: 39.8 % (ref 36.0–46.0)
Hemoglobin: 13.5 g/dL (ref 12.0–15.0)
LYMPHS ABS: 3.6 10*3/uL (ref 0.7–4.0)
Lymphocytes Relative: 34 %
MCH: 29.9 pg (ref 26.0–34.0)
MCHC: 33.9 g/dL (ref 30.0–36.0)
MCV: 88.2 fL (ref 78.0–100.0)
MONO ABS: 0.8 10*3/uL (ref 0.1–1.0)
MONOS PCT: 8 %
Neutro Abs: 5.9 10*3/uL (ref 1.7–7.7)
Neutrophils Relative %: 55 %
Platelets: 417 10*3/uL — ABNORMAL HIGH (ref 150–400)
RBC: 4.51 MIL/uL (ref 3.87–5.11)
RDW: 12.6 % (ref 11.5–15.5)
WBC: 10.6 10*3/uL — ABNORMAL HIGH (ref 4.0–10.5)

## 2017-10-19 LAB — PROTIME-INR
INR: 0.96
Prothrombin Time: 12.7 seconds (ref 11.4–15.2)

## 2017-10-19 LAB — SURGICAL PCR SCREEN
MRSA, PCR: NEGATIVE
STAPHYLOCOCCUS AUREUS: NEGATIVE

## 2017-10-19 LAB — TYPE AND SCREEN
ABO/RH(D): O POS
ANTIBODY SCREEN: NEGATIVE

## 2017-10-19 LAB — APTT: aPTT: 36 seconds (ref 24–36)

## 2017-10-19 NOTE — Pre-Procedure Instructions (Signed)
Danielle Schmitt  10/19/2017    Your procedure is scheduled on Tuesday, November 6.  Report to [facility] Admitting at 5:30 AM                Your surgery or procedure is scheduled for 7:15 AM   Call this number if you have problems the morning of surgery: [phone]               For any other questions, please call [phone], Monday - Friday 8 AM - 4 PM.     Remember:  Do not eat food or drink liquids after midnight Monday, November 5.  Take these medicines the morning of surgery with A SIP OF WATER:  FLUoxetine (PROZAC) loratadine (CLARITIN) methocarbamol (ROBAXIN) May take if needed: HYDROcodone-acetaminophen (NORCO/VICODIN)    STOP taking Aspirin, Aspirin Products (Goody Powder, Excedrin Migraine), Ibuprofen (Advil), Naproxen (Aleve), Vitamins and Herbal Products (ie Fish Oil)  Special instructions:  [facility]- Preparing For Surgery  Before surgery, you can play an important role. Because skin is not sterile, your skin needs to be as free of germs as possible. You can reduce the number of germs on your skin by washing with CHG (chlorahexidine gluconate) Soap before surgery.  CHG is an antiseptic cleaner which kills germs and bonds with the skin to continue killing germs even after washing.  Please do not use if you have an allergy to CHG or antibacterial soaps. If your skin becomes reddened/irritated stop using the CHG.  Do not shave (including legs and underarms) for at least 48 hours prior to first CHG shower. It is OK to shave your face.  Please follow these instructions carefully.   1. Shower the NIGHT BEFORE SURGERY and the MORNING OF SURGERY with CHG.   2. If you chose to wash your hair, wash your hair first as usual with your normal shampoo.  3. After you shampoo, rinse your hair and body thoroughly to remove the shampoo.  4. Use CHG as you would any other liquid soap. You can apply CHG directly to the skin and wash gently with a scrungie or a  clean washcloth.   5. Apply the CHG Soap to your body ONLY FROM THE NECK DOWN.  Do not use on open wounds or open sores. Avoid contact with your eyes, ears, mouth and genitals (private parts). Wash Face and genitals (private parts)  with your normal soap.  6. Wash thoroughly, paying special attention to the area where your surgery will be performed.  7. Thoroughly rinse your body with warm water from the neck down.  8. DO NOT shower/wash with your normal soap after using and rinsing off the CHG Soap.  9. Pat yourself dry with a CLEAN TOWEL.  10. Wear CLEAN PAJAMAS to bed the night before surgery, wear comfortable clothes the morning of surgery  11. Place CLEAN SHEETS on your bed the night of your first shower and DO NOT SLEEP WITH PETS.  Day of Surgery: Do not apply any deodorants/lotions,powders,cologne. Please wear clean clothes to the hospital/surgery center.   Do not wear jewelry, make-up or nail polish.  Do not wear lotions, powders, or perfumes, or deoderant.  Do not shave 48 hours prior to surgery.  Men may shave face and neck.  Do not bring valuables to the hospital.  [facility] is not responsible for any belongings or valuables.  Contacts, dentures or bridgework may not be worn into surgery.  Leave your suitcase in  the car.  After surgery it may be brought to your room.  For patients admitted to the hospital, discharge time will be determined by your treatment team.  Please read over the following fact sheets that you were given:  Pain Booklet, Patient Instructions for Mupirocin Application, Incentive Spirometry, Surgical Site Infections.

## 2017-10-21 LAB — URINE CULTURE

## 2017-10-22 ENCOUNTER — Other Ambulatory Visit (HOSPITAL_COMMUNITY): Payer: Self-pay

## 2017-10-22 NOTE — Progress Notes (Addendum)
Anesthesia Chart Review:   Patient is a 61 year old female scheduled for right TKA on 10/30/17 by Dr. Norlene CampbellPeter Schmitt.  Danielle Schmitt- Rece[facility] primary care at [facility] in [place], [place]  PMH includes:  HTN, asthma, anemia. Never smoker. MBI 41. S/p L THA 06/17/13. S/p L TKA 2011  Anesthesia history: post-operative bronchospasm in the past and spinal headache following an epidural.   Medications include: albuterol, lisinopril-hctz  BP 128/74 Comment: manual after sitting 20 minutes  Pulse (P) 73   Temp (P) 36.8 C   Resp (P) 20   Ht (P) 5\' 3"  (1.6 m)   Wt (P) 218 lb 14.4 oz (99.3 kg)   SpO2 (P) 97%   BMI (P) 38.78 kg/m   Preoperative labs reviewed.  Urine culture positive for E coli. I notified Danielle MaxwellCheryl in Dr. Hoy Schmitt's office.   CXR 10/19/17:  1. Chronic elevation of the right hemidiaphragm. Associated right basilar opacity likely reflects atelectasis/bronchovascular crowding. Infectious infiltrate could be considered in the correct clinical setting. 2. Underlying scattered peribronchial thickening, suspected be related history of asthma. Active bronchiolitis could also be considered.  EKG 10/19/17: NSR. Minimal voltage criteria for LVH, may be normal variant. Inferior infarct, age undetermined. Comparison EKGs found in correspondence 06/21/13.  Although tracings are poor quality copies, it appears EKG is stable dating back to 2010.   If no changes, I anticipate pt can proceed with surgery as scheduled.   Danielle Mastngela Khyre Germond, FNP-BC [facility]/Anesthesiology Phone: [phone] 10/26/2017 3:36 PM

## 2017-10-23 ENCOUNTER — Telehealth (INDEPENDENT_AMBULATORY_CARE_PROVIDER_SITE_OTHER): Payer: Self-pay | Admitting: Orthopedic Surgery

## 2017-10-23 NOTE — Telephone Encounter (Signed)
Please call and have her take septra DS 1 tab po bid for 5 days- send prescription to her pharmacy

## 2017-10-23 NOTE — Telephone Encounter (Signed)
LVMOM to call me with correct pharmacy.

## 2017-10-23 NOTE — Telephone Encounter (Signed)
See prior note

## 2017-10-23 NOTE — Telephone Encounter (Signed)
Please advise 

## 2017-10-23 NOTE — Telephone Encounter (Signed)
Ms. Danielle Schmitt is scheduled for surgery on 10/30/2017.  [name], NP with Short Stay called to advise that her pre-op urine was abnormal, looks like she has a UTI.  Please review.

## 2017-10-24 ENCOUNTER — Encounter (INDEPENDENT_AMBULATORY_CARE_PROVIDER_SITE_OTHER): Payer: Self-pay | Admitting: Orthopedic Surgery

## 2017-10-24 ENCOUNTER — Ambulatory Visit (INDEPENDENT_AMBULATORY_CARE_PROVIDER_SITE_OTHER): Payer: BLUE CROSS/BLUE SHIELD | Admitting: Orthopedic Surgery

## 2017-10-24 ENCOUNTER — Ambulatory Visit (INDEPENDENT_AMBULATORY_CARE_PROVIDER_SITE_OTHER): Payer: Self-pay

## 2017-10-24 VITALS — BP 123/74 | HR 70 | Temp 98.6°F | Resp 16 | Ht 63.0 in | Wt 220.0 lb

## 2017-10-24 DIAGNOSIS — M1711 Unilateral primary osteoarthritis, right knee: Secondary | ICD-10-CM | POA: Diagnosis not present

## 2017-10-24 MED ORDER — SULFAMETHOXAZOLE-TRIMETHOPRIM 800-160 MG PO TABS: 1.0000 | ORAL_TABLET | Freq: Two times a day (BID) | ORAL | 0 refills | Status: DC

## 2017-10-24 NOTE — Progress Notes (Signed)
Subjective:  Chief Complaint:right knee pain.  HPI: Danielle Schmitt, 61 y.o. female, has a history of pain and functional disability in the right knee due to arthritis and has failed non-surgical conservative treatments for greater than 12 weeks to includeNSAID's and/or analgesics, corticosteriod injections, viscosupplementation injections, flexibility and strengthening excercises, use of assistive devices, weight reduction as appropriate and activity modification.  Onset of symptoms was gradual, starting >10 years ago with rapidlly worsening course since that time. The patient noted prior procedures on the knee to include  arthroscopy and menisectomy on the right knee(s).  Patient currently rates pain in the right knee(s) at 10 out of 10 with activity. Patient has night pain, worsening of pain with activity and weight bearing, pain that interferes with activities of daily living, pain with passive range of motion, crepitus and joint swelling.  Patient has evidence of subchondral cysts, subchondral sclerosis, periarticular osteophytes, joint subluxation and joint space narrowing by imaging studies. There is no active infection.  Patient Active Problem List   Diagnosis Date Noted  . Postoperative anemia due to acute blood loss 06/19/2013  . Osteoarthritis of left hip 06/17/2013  . Morbid obesity (HCC) 06/17/2013  . Asthma 06/17/2013   Past Medical History:  Diagnosis Date  . Anemia   . Asthma   . B12 deficiency    Hx: of  . Complication of anesthesia    Hx: of bronchospasm attack during procedure- hystrectomy  . Degenerative joint disease   . Depression   . Environmental allergies    Hx : of  . History of blood transfusion    knee replacement and hip replacement  . Hypertension   . Incontinence of urine    "due to vaginal hysterectopmy  . Pneumonia    2014  . Spinal headache    ;Hx: of headache with Epidural; migraines    Past Surgical History:  Procedure Laterality Date  . BACK  SURGERY     Hx: of synovial removal  . CARPAL TUNNEL RELEASE     Hx: of B/L wrists  . COLONOSCOPY W/ BIOPSIES AND POLYPECTOMY     Hx: of  . JOINT REPLACEMENT    . KNEE ARTHROPLASTY Left   . KNEE SURGERY Right    Insoll procedure  . Right knee surgery     Hx: numerous - arthroscopy   . right shoulder     Hx: of roatator cuff repair  . SHOULDER SURGERY    . TONSILLECTOMY    . TOTAL HIP ARTHROPLASTY Left 06/17/2013   Procedure: LEFT TOTAL HIP ARTHROPLASTY;  Surgeon: Valeria Batman, MD;  Location[facility] OR;  Service: Orthopedics;  Laterality: Left;  Marland Kitchen VAGINAL HYSTERECTOMY     total    No current facility-administered medications for this encounter.    Current Outpatient Prescriptions  Medication Sig Dispense Refill Last Dose  . acetaminophen (TYLENOL) 500 MG tablet Take 1,000 mg by mouth every 6 (six) hours as needed for moderate pain or headache.   Taking  . albuterol (PROVENTIL HFA;VENTOLIN HFA) 108 (90 BASE) MCG/ACT inhaler Inhale 2 puffs into the lungs every 6 (six) hours as needed for wheezing.    Taking  . celecoxib (CELEBREX) 200 MG capsule Take 200 mg by mouth daily.    Taking  . cyanocobalamin (,VITAMIN B-12,) 1000 MCG/ML injection Inject 1,000 mcg into the muscle every 30 (thirty) days.   Taking  . ergocalciferol (VITAMIN D2) 50000 UNITS capsule Take 50,000 Units by mouth every Wednesday.    Taking  .  FLUoxetine (PROZAC) 40 MG capsule Take 40 mg by mouth daily.    Taking  . fluticasone (FLONASE) 50 MCG/ACT nasal spray Place 1 spray into both nostrils 2 (two) times daily as needed for allergies or rhinitis.   Taking  . gabapentin (NEURONTIN) 100 MG capsule Take 100 mg by mouth at bedtime.   Taking  . HYDROcodone-acetaminophen (NORCO/VICODIN) 5-325 MG tablet Take 1 tablet by mouth 3 (three) times daily.   Taking  . Ketotifen Fumarate (ALAWAY OP) Place 1 drop into both eyes as needed (for allergies).   Taking  . lisinopril-hydrochlorothiazide (PRINZIDE,ZESTORETIC) 20-25 MG per  tablet Take 1 tablet by mouth daily.   Taking  . loratadine (CLARITIN) 10 MG tablet Take 10 mg by mouth daily.   Taking  . methocarbamol (ROBAXIN) 500 MG tablet Take 1 tablet (500 mg total) by mouth every 6 (six) hours as needed. (Patient taking differently: Take 750 mg by mouth 3 (three) times daily. ) 30 tablet 0 Taking  . montelukast (SINGULAIR) 10 MG tablet Take 10 mg by mouth at bedtime.   Taking  . Multiple Vitamins-Minerals (MULTIVITAMIN PO) Take 1 tablet by mouth daily.   Taking  . tolterodine (DETROL) 2 MG tablet Take 2 mg by mouth 2 (two) times daily.   Taking  . docusate sodium 100 MG CAPS Take 100 mg by mouth 2 (two) times daily. (Patient not taking: Reported on 10/18/2017) 10 capsule 0 Not Taking  . oxyCODONE (OXY IR/ROXICODONE) 5 MG immediate release tablet Take 1-2 tablets (5-10 mg total) by mouth every 3 (three) hours as needed. 30 tablet 0 Taking  . rivaroxaban (XARELTO) 10 MG TABS tablet Take 1 tablet (10 mg total) by mouth at bedtime. (Patient not taking: Reported on 09/12/2017) 30 tablet 0 Not Taking   Allergies  Allergen Reactions  . Augmentin [Amoxicillin-Pot Clavulanate] Rash and Other (See Comments)    Allergic to the Pot-clavulante in the augmentin    Social History  Substance Use Topics  . Smoking status: Never Smoker  . Smokeless tobacco: Never Used  . Alcohol use No    Family History  Problem Relation Age of Onset  . Cancer - Lung Mother   . Hypertension Mother   . Hypertension Father   . Kidney failure Father   . Hypertension Brother      Review of Systems  Constitutional: Negative.   HENT: Negative for ear pain, hearing loss and nosebleeds.        Cataracts  Eyes: Negative for blurred vision.  Respiratory: Negative.   Cardiovascular: Negative for chest pain, palpitations and leg swelling.  Gastrointestinal: Negative for abdominal pain, constipation, diarrhea, heartburn and vomiting.  Genitourinary:       Nocturia 1-2 times/ hs  Musculoskeletal:  Positive for back pain and joint pain.  Skin: Negative.   Neurological: Positive for headaches. Negative for dizziness and tremors.  Endo/Heme/Allergies: Positive for environmental allergies.  Psychiatric/Behavioral: Negative for depression and suicidal ideas.    Objective:  Physical Exam  Constitutional: She is oriented to person, place, and time. She appears well-developed and well-nourished. She appears distressed (secondary to pain).  HENT:  Head: Normocephalic and atraumatic.  Eyes: Pupils are equal, round, and reactive to light. Conjunctivae and EOM are normal.  Neck: Neck supple. No thyromegaly present.  Cardiovascular: Normal rate, regular rhythm, normal heart sounds and intact distal pulses.   No murmur heard. Respiratory: Effort normal and breath sounds normal. She has no wheezes.  GI: Soft. Bowel sounds are normal. There is  no tenderness.  Neurological: She is alert and oriented to person, place, and time.  Skin: Skin is warm and dry. She is not diaphoretic.  Psychiatric: She has a normal mood and affect. Her behavior is normal. Judgment and thought content normal.  Musculoskeletal: The right knee has skin intact. She has range of motion from about 8 to about 95.  Vital signs in last 24 hours: Temp:  [98.6 F (37 C)] 98.6 F (37 C) (10/31 0900) Pulse Rate:  [70] 70 (10/31 0900) Resp:  [16] 16 (10/31 0900) BP: (123)/(74) 123/74 (10/31 0900) Weight:  [220 lb (99.8 kg)] 220 lb (99.8 kg) (10/31 0900)  Estimated body mass index is 38.97 kg/m as calculated from the following:   Height as of 10/24/17: 5\' 3"  (1.6 m).   Weight as of 10/24/17: 220 lb (99.8 kg).   Imaging Review Plain radiographs demonstrate severe degenerative joint disease of the right knee(s). The overall alignment issignificant varus. The bone quality appears to be good for age and reported activity level.  Assessment/Plan:  End stage arthritis, right knee   The patient history, physical  examination, clinical judgment of the provider and imaging studies are consistent with end stage degenerative joint disease of the right knee(s) and total knee arthroplasty is deemed medically necessary. The treatment options including medical management, injection therapy arthroscopy and arthroplasty were discussed at length. The risks and benefits of total knee arthroplasty were presented and reviewed. The risks due to aseptic loosening, infection, stiffness, patella tracking problems, thromboembolic complications and other imponderables were discussed. The patient acknowledged the explanation, agreed to proceed with the plan and consent was signed. Patient is being admitted for inpatient treatment for surgery, pain control, PT, OT, prophylactic antibiotics, VTE prophylaxis, progressive ambulation and ADL's and discharge planning. The patient is planning to be discharged home with home health services  Oris Drone. Aleda Grana [facility] [phone]  10/24/2017 10:30 AM

## 2017-10-24 NOTE — H&P (Signed)
TOTAL KNEE ADMISSION H&P  Patient is being admitted for right total knee arthroplasty.  Subjective:  Chief Complaint:right knee pain.  HPI: Danielle Schmitt, 61 y.o. female, has a history of pain and functional disability in the right knee due to arthritis and has failed non-surgical conservative treatments for greater than 12 weeks to includeNSAID's and/or analgesics, corticosteriod injections, viscosupplementation injections, flexibility and strengthening excercises, use of assistive devices, weight reduction as appropriate and activity modification.  Onset of symptoms was gradual, starting >10 years ago with rapidlly worsening course since that time. The patient noted prior procedures on the knee to include  arthroscopy and menisectomy on the right knee(s).  Patient currently rates pain in the right knee(s) at 10 out of 10 with activity. Patient has night pain, worsening of pain with activity and weight bearing, pain that interferes with activities of daily living, pain with passive range of motion, crepitus and joint swelling.  Patient has evidence of subchondral cysts, subchondral sclerosis, periarticular osteophytes, joint subluxation and joint space narrowing by imaging studies. There is no active infection.  Patient Active Problem List   Diagnosis Date Noted  . Postoperative anemia due to acute blood loss 06/19/2013  . Osteoarthritis of left hip 06/17/2013  . Morbid obesity (HCC) 06/17/2013  . Asthma 06/17/2013   Past Medical History:  Diagnosis Date  . Anemia   . Asthma   . B12 deficiency    Hx: of  . Complication of anesthesia    Hx: of bronchospasm attack during procedure- hystrectomy  . Degenerative joint disease   . Depression   . Environmental allergies    Hx : of  . History of blood transfusion    knee replacement and hip replacement  . Hypertension   . Incontinence of urine    "due to vaginal hysterectopmy  . Pneumonia    2014  . Spinal headache    ;Hx: of headache  with Epidural; migraines    Past Surgical History:  Procedure Laterality Date  . BACK SURGERY     Hx: of synovial removal  . CARPAL TUNNEL RELEASE     Hx: of B/L wrists  . COLONOSCOPY W/ BIOPSIES AND POLYPECTOMY     Hx: of  . JOINT REPLACEMENT    . KNEE ARTHROPLASTY Left   . KNEE SURGERY Right    Insoll procedure  . Right knee surgery     Hx: numerous - arthroscopy   . right shoulder     Hx: of roatator cuff repair  . SHOULDER SURGERY    . TONSILLECTOMY    . TOTAL HIP ARTHROPLASTY Left 06/17/2013   Procedure: LEFT TOTAL HIP ARTHROPLASTY;  Surgeon: Valeria Batman, MD;  Location: [facility];  Service: Orthopedics;  Laterality: Left;  Marland Kitchen VAGINAL HYSTERECTOMY     total    No current facility-administered medications for this encounter.    Current Outpatient Prescriptions  Medication Sig Dispense Refill Last Dose  . acetaminophen (TYLENOL) 500 MG tablet Take 1,000 mg by mouth every 6 (six) hours as needed for moderate pain or headache.   Taking  . albuterol (PROVENTIL HFA;VENTOLIN HFA) 108 (90 BASE) MCG/ACT inhaler Inhale 2 puffs into the lungs every 6 (six) hours as needed for wheezing.    Taking  . celecoxib (CELEBREX) 200 MG capsule Take 200 mg by mouth daily.    Taking  . cyanocobalamin (,VITAMIN B-12,) 1000 MCG/ML injection Inject 1,000 mcg into the muscle every 30 (thirty) days.   Taking  . ergocalciferol (VITAMIN D2)  50000 UNITS capsule Take 50,000 Units by mouth every Wednesday.    Taking  . FLUoxetine (PROZAC) 40 MG capsule Take 40 mg by mouth daily.    Taking  . fluticasone (FLONASE) 50 MCG/ACT nasal spray Place 1 spray into both nostrils 2 (two) times daily as needed for allergies or rhinitis.   Taking  . gabapentin (NEURONTIN) 100 MG capsule Take 100 mg by mouth at bedtime.   Taking  . HYDROcodone-acetaminophen (NORCO/VICODIN) 5-325 MG tablet Take 1 tablet by mouth 3 (three) times daily.   Taking  . Ketotifen Fumarate (ALAWAY OP) Place 1 drop into both eyes as needed (for  allergies).   Taking  . lisinopril-hydrochlorothiazide (PRINZIDE,ZESTORETIC) 20-25 MG per tablet Take 1 tablet by mouth daily.   Taking  . loratadine (CLARITIN) 10 MG tablet Take 10 mg by mouth daily.   Taking  . methocarbamol (ROBAXIN) 500 MG tablet Take 1 tablet (500 mg total) by mouth every 6 (six) hours as needed. (Patient taking differently: Take 750 mg by mouth 3 (three) times daily. ) 30 tablet 0 Taking  . montelukast (SINGULAIR) 10 MG tablet Take 10 mg by mouth at bedtime.   Taking  . Multiple Vitamins-Minerals (MULTIVITAMIN PO) Take 1 tablet by mouth daily.   Taking  . tolterodine (DETROL) 2 MG tablet Take 2 mg by mouth 2 (two) times daily.   Taking  . docusate sodium 100 MG CAPS Take 100 mg by mouth 2 (two) times daily. (Patient not taking: Reported on 10/18/2017) 10 capsule 0 Not Taking  . oxyCODONE (OXY IR/ROXICODONE) 5 MG immediate release tablet Take 1-2 tablets (5-10 mg total) by mouth every 3 (three) hours as needed. 30 tablet 0 Taking  . rivaroxaban (XARELTO) 10 MG TABS tablet Take 1 tablet (10 mg total) by mouth at bedtime. (Patient not taking: Reported on 09/12/2017) 30 tablet 0 Not Taking   Allergies  Allergen Reactions  . Augmentin [Amoxicillin-Pot Clavulanate] Rash and Other (See Comments)    Allergic to the Pot-clavulante in the augmentin    Social History  Substance Use Topics  . Smoking status: Never Smoker  . Smokeless tobacco: Never Used  . Alcohol use No    Family History  Problem Relation Age of Onset  . Cancer - Lung Mother   . Hypertension Mother   . Hypertension Father   . Kidney failure Father   . Hypertension Brother      Review of Systems  Constitutional: Negative.   HENT: Negative for ear pain, hearing loss and nosebleeds.        Cataracts  Eyes: Negative for blurred vision.  Respiratory: Negative.   Cardiovascular: Negative for chest pain, palpitations and leg swelling.  Gastrointestinal: Negative for abdominal pain, constipation, diarrhea,  heartburn and vomiting.  Genitourinary:       Nocturia 1-2 times/ hs  Musculoskeletal: Positive for back pain and joint pain.  Skin: Negative.   Neurological: Positive for headaches. Negative for dizziness and tremors.  Endo/Heme/Allergies: Positive for environmental allergies.  Psychiatric/Behavioral: Negative for depression and suicidal ideas.    Objective:  Physical Exam  Constitutional: She is oriented to person, place, and time. She appears well-developed and well-nourished. She appears distressed (secondary to pain).  HENT:  Head: Normocephalic and atraumatic.  Eyes: Pupils are equal, round, and reactive to light. Conjunctivae and EOM are normal.  Neck: Neck supple. No thyromegaly present.  Cardiovascular: Normal rate, regular rhythm, normal heart sounds and intact distal pulses.   No murmur heard. Respiratory: Effort normal and  breath sounds normal. She has no wheezes.  GI: Soft. Bowel sounds are normal. There is no tenderness.  Neurological: She is alert and oriented to person, place, and time.  Skin: Skin is warm and dry. She is not diaphoretic.  Psychiatric: She has a normal mood and affect. Her behavior is normal. Judgment and thought content normal.  Musculoskeletal: The right knee has skin intact. She has range of motion from about 8 to about 95.  Vital signs in last 24 hours: Temp:  [98.6 F (37 C)] 98.6 F (37 C) (10/31 0900) Pulse Rate:  [70] 70 (10/31 0900) Resp:  [16] 16 (10/31 0900) BP: (123)/(74) 123/74 (10/31 0900) Weight:  [220 lb (99.8 kg)] 220 lb (99.8 kg) (10/31 0900)  Estimated body mass index is 38.97 kg/m as calculated from the following:   Height as of 10/24/17: 5\' 3"  (1.6 m).   Weight as of 10/24/17: 220 lb (99.8 kg).   Imaging Review Plain radiographs demonstrate severe degenerative joint disease of the right knee(s). The overall alignment issignificant varus. The bone quality appears to be good for age and reported activity  level.  Assessment/Plan:  End stage arthritis, right knee   The patient history, physical examination, clinical judgment of the provider and imaging studies are consistent with end stage degenerative joint disease of the right knee(s) and total knee arthroplasty is deemed medically necessary. The treatment options including medical management, injection therapy arthroscopy and arthroplasty were discussed at length. The risks and benefits of total knee arthroplasty were presented and reviewed. The risks due to aseptic loosening, infection, stiffness, patella tracking problems, thromboembolic complications and other imponderables were discussed. The patient acknowledged the explanation, agreed to proceed with the plan and consent was signed. Patient is being admitted for inpatient treatment for surgery, pain control, PT, OT, prophylactic antibiotics, VTE prophylaxis, progressive ambulation and ADL's and discharge planning. The patient is planning to be discharged home with home health services

## 2017-10-24 NOTE — Telephone Encounter (Signed)
Thank you :)

## 2017-10-24 NOTE — Telephone Encounter (Signed)
Saw her today and wrote a prescription for her for Septra DS

## 2017-10-26 ENCOUNTER — Other Ambulatory Visit (INDEPENDENT_AMBULATORY_CARE_PROVIDER_SITE_OTHER): Payer: Self-pay

## 2017-10-29 MED ORDER — SODIUM CHLORIDE 0.9 % IV SOLN
2000.0000 mg | INTRAVENOUS | Status: DC
  Filled 2017-10-29 (×2): qty 20

## 2017-10-29 NOTE — Anesthesia Preprocedure Evaluation (Signed)
Anesthesia Evaluation  Patient identified by MRN, date of birth, ID band Patient awake    Reviewed: Allergy & Precautions, H&P , Patient's Chart, lab work & pertinent test results, reviewed documented beta blocker date and time   Airway Mallampati: II  TM Distance: >3 FB Neck ROM: full    Dental no notable dental hx.    Pulmonary    Pulmonary exam normal breath sounds clear to auscultation       Cardiovascular hypertension,  Rhythm:regular Rate:Normal     Neuro/Psych    GI/Hepatic   Endo/Other    Renal/GU      Musculoskeletal   Abdominal   Peds  Hematology   Anesthesia Other Findings MO HTN DM    Hx of PDPHA; Hx of asthma....... Hx of Bronchospasm with prev GA   Reproductive/Obstetrics                             Anesthesia Physical Anesthesia Plan  ASA: III  Anesthesia Plan: Spinal   Post-op Pain Management:    Induction:   PONV Risk Score and Plan: 2 and Dexamethasone, Ondansetron and Treatment may vary due to age or medical condition  Airway Management Planned:   Additional Equipment:   Intra-op Plan:   Post-operative Plan:   Informed Consent: I have reviewed the patients History and Physical, chart, labs and discussed the procedure including the risks, benefits and alternatives for the proposed anesthesia with the patient or authorized representative who has indicated his/her understanding and acceptance.   Dental Advisory Given  Plan Discussed with: CRNA and Surgeon  Anesthesia Plan Comments: ( S/p back surg; may be better as GA Hx of PDPHA  )        Anesthesia Quick Evaluation

## 2017-10-30 ENCOUNTER — Inpatient Hospital Stay (HOSPITAL_COMMUNITY): Payer: BLUE CROSS/BLUE SHIELD | Admitting: Vascular Surgery

## 2017-10-30 ENCOUNTER — Inpatient Hospital Stay (HOSPITAL_COMMUNITY)
Admission: RE | Admit: 2017-10-30 | Discharge: 2017-11-01 | DRG: 470 | Disposition: A | Discharge status: Home / Self Care | Payer: BLUE CROSS/BLUE SHIELD | Source: Ambulatory Visit | Attending: Orthopaedic Surgery | Admitting: Orthopaedic Surgery

## 2017-10-30 ENCOUNTER — Inpatient Hospital Stay (HOSPITAL_COMMUNITY): Payer: BLUE CROSS/BLUE SHIELD | Admitting: Anesthesiology

## 2017-10-30 ENCOUNTER — Encounter (HOSPITAL_COMMUNITY)
Admission: RE | Disposition: A | Discharge status: Home / Self Care | Payer: Self-pay | Source: Ambulatory Visit | Attending: Orthopaedic Surgery

## 2017-10-30 DIAGNOSIS — Z7901 Long term (current) use of anticoagulants: Secondary | ICD-10-CM | POA: Diagnosis not present

## 2017-10-30 DIAGNOSIS — D62 Acute posthemorrhagic anemia: Secondary | ICD-10-CM | POA: Diagnosis not present

## 2017-10-30 DIAGNOSIS — J45909 Unspecified asthma, uncomplicated: Secondary | ICD-10-CM | POA: Diagnosis present

## 2017-10-30 DIAGNOSIS — Z79899 Other long term (current) drug therapy: Secondary | ICD-10-CM | POA: Diagnosis not present

## 2017-10-30 DIAGNOSIS — R110 Nausea: Secondary | ICD-10-CM | POA: Diagnosis not present

## 2017-10-30 DIAGNOSIS — F329 Major depressive disorder, single episode, unspecified: Secondary | ICD-10-CM | POA: Diagnosis present

## 2017-10-30 DIAGNOSIS — Z6838 Body mass index (BMI) 38.0-38.9, adult: Secondary | ICD-10-CM | POA: Diagnosis not present

## 2017-10-30 DIAGNOSIS — M1711 Unilateral primary osteoarthritis, right knee: Secondary | ICD-10-CM | POA: Diagnosis present

## 2017-10-30 DIAGNOSIS — Z96652 Presence of left artificial knee joint: Secondary | ICD-10-CM | POA: Diagnosis present

## 2017-10-30 DIAGNOSIS — I10 Essential (primary) hypertension: Secondary | ICD-10-CM | POA: Diagnosis present

## 2017-10-30 DIAGNOSIS — Z880 Allergy status to penicillin: Secondary | ICD-10-CM | POA: Diagnosis not present

## 2017-10-30 DIAGNOSIS — Z96642 Presence of left artificial hip joint: Secondary | ICD-10-CM | POA: Diagnosis present

## 2017-10-30 DIAGNOSIS — Z791 Long term (current) use of non-steroidal anti-inflammatories (NSAID): Secondary | ICD-10-CM | POA: Diagnosis not present

## 2017-10-30 DIAGNOSIS — Z96651 Presence of right artificial knee joint: Secondary | ICD-10-CM

## 2017-10-30 HISTORY — PX: TOTAL KNEE ARTHROPLASTY: SHX125

## 2017-10-30 HISTORY — DX: Iron deficiency anemia, unspecified: D50.9

## 2017-10-30 HISTORY — DX: Migraine, unspecified, not intractable, without status migrainosus: G43.909

## 2017-10-30 HISTORY — DX: Low back pain, unspecified: M54.50

## 2017-10-30 HISTORY — DX: Unspecified osteoarthritis, unspecified site: M19.90

## 2017-10-30 HISTORY — DX: Other chronic pain: G89.29

## 2017-10-30 HISTORY — DX: Other chondrocalcinosis, right knee: M11.261

## 2017-10-30 HISTORY — DX: Vitamin B12 deficiency anemia, unspecified: D51.9

## 2017-10-30 HISTORY — DX: Low back pain: M54.5

## 2017-10-30 SURGERY — ARTHROPLASTY, KNEE, TOTAL
Anesthesia: Spinal | Site: Knee | Laterality: Right

## 2017-10-30 MED ORDER — LISINOPRIL 20 MG PO TABS
20.0000 mg | ORAL_TABLET | Freq: Every day | ORAL | Status: DC
  Filled 2017-10-30 (×3): qty 1

## 2017-10-30 MED ORDER — ACETAMINOPHEN 10 MG/ML IV SOLN
1000.0000 mg | Freq: Four times a day (QID) | INTRAVENOUS | Status: DC
Start: 2017-10-30 — End: 2017-10-30

## 2017-10-30 MED ORDER — HYDROMORPHONE HCL 1 MG/ML IJ SOLN
0.5000 mg | INTRAMUSCULAR | Status: DC | PRN
  Administered 2017-10-30 (×2): 1 mg via INTRAVENOUS
  Filled 2017-10-30: qty 1

## 2017-10-30 MED ORDER — PROPOFOL 10 MG/ML IV BOLUS
INTRAVENOUS | Status: DC | PRN
  Administered 2017-10-30: 30 mg via INTRAVENOUS

## 2017-10-30 MED ORDER — HYDROMORPHONE HCL 1 MG/ML IJ SOLN
INTRAMUSCULAR | Status: AC
  Administered 2017-10-30: 1 mg via INTRAVENOUS
  Filled 2017-10-30: qty 1

## 2017-10-30 MED ORDER — CEFAZOLIN SODIUM-DEXTROSE 2-4 GM/100ML-% IV SOLN
INTRAVENOUS | Status: AC
  Filled 2017-10-30: qty 100

## 2017-10-30 MED ORDER — BUPIVACAINE-EPINEPHRINE 0.25% -1:200000 IJ SOLN
INTRAMUSCULAR | Status: DC | PRN
  Administered 2017-10-30: 30 mL

## 2017-10-30 MED ORDER — GABAPENTIN 100 MG PO CAPS
100.0000 mg | ORAL_CAPSULE | Freq: Every day | ORAL | Status: DC
  Administered 2017-10-30 – 2017-10-31 (×2): 100 mg via ORAL
  Filled 2017-10-30 (×2): qty 1

## 2017-10-30 MED ORDER — KETOTIFEN FUMARATE 0.025 % OP SOLN
1.0000 [drp] | OPHTHALMIC | Status: DC | PRN
  Filled 2017-10-30: qty 5

## 2017-10-30 MED ORDER — MIDAZOLAM HCL 2 MG/2ML IJ SOLN
INTRAMUSCULAR | Status: AC
  Filled 2017-10-30: qty 2

## 2017-10-30 MED ORDER — DIPHENHYDRAMINE HCL 12.5 MG/5ML PO ELIX: 12.5000 mg | ORAL_SOLUTION | ORAL | Status: DC | PRN

## 2017-10-30 MED ORDER — ONDANSETRON HCL 4 MG PO TABS
4.0000 mg | ORAL_TABLET | Freq: Four times a day (QID) | ORAL | Status: DC | PRN
  Administered 2017-10-30: 4 mg via ORAL
  Filled 2017-10-30: qty 1

## 2017-10-30 MED ORDER — RIVAROXABAN 10 MG PO TABS
10.0000 mg | ORAL_TABLET | Freq: Every day | ORAL | Status: DC
  Administered 2017-10-31 – 2017-11-01 (×2): 10 mg via ORAL
  Filled 2017-10-30 (×2): qty 1

## 2017-10-30 MED ORDER — BISACODYL 10 MG RE SUPP: 10.0000 mg | Freq: Every day | RECTAL | Status: DC | PRN

## 2017-10-30 MED ORDER — MIDAZOLAM HCL 5 MG/5ML IJ SOLN
INTRAMUSCULAR | Status: DC | PRN
  Administered 2017-10-30 (×2): 2 mg via INTRAVENOUS

## 2017-10-30 MED ORDER — BUPIVACAINE-EPINEPHRINE (PF) 0.25% -1:200000 IJ SOLN
INTRAMUSCULAR | Status: AC
  Filled 2017-10-30: qty 30

## 2017-10-30 MED ORDER — ACETAMINOPHEN 10 MG/ML IV SOLN
1000.0000 mg | Freq: Four times a day (QID) | INTRAVENOUS | Status: DC
  Administered 2017-10-30 – 2017-10-31 (×3): 1000 mg via INTRAVENOUS
  Filled 2017-10-30 (×3): qty 100

## 2017-10-30 MED ORDER — VITAMIN D (ERGOCALCIFEROL) 1.25 MG (50000 UNIT) PO CAPS
50000.0000 [IU] | ORAL_CAPSULE | ORAL | Status: DC
  Administered 2017-10-31: 50000 [IU] via ORAL
  Filled 2017-10-30: qty 1

## 2017-10-30 MED ORDER — ONDANSETRON HCL 4 MG/2ML IJ SOLN
4.0000 mg | Freq: Four times a day (QID) | INTRAMUSCULAR | Status: DC | PRN
  Administered 2017-10-30: 4 mg via INTRAVENOUS
  Filled 2017-10-30: qty 2

## 2017-10-30 MED ORDER — PHENOL 1.4 % MT LIQD: 1.0000 | OROMUCOSAL | Status: DC | PRN

## 2017-10-30 MED ORDER — OXYCODONE HCL 5 MG PO TABS
5.0000 mg | ORAL_TABLET | ORAL | Status: DC | PRN
  Administered 2017-10-30 – 2017-11-01 (×2): 5 mg via ORAL
  Filled 2017-10-30 (×3): qty 1

## 2017-10-30 MED ORDER — DOCUSATE SODIUM 100 MG PO CAPS
100.0000 mg | ORAL_CAPSULE | Freq: Two times a day (BID) | ORAL | Status: DC
  Administered 2017-10-30 – 2017-11-01 (×5): 100 mg via ORAL
  Filled 2017-10-30 (×5): qty 1

## 2017-10-30 MED ORDER — ALBUTEROL SULFATE (2.5 MG/3ML) 0.083% IN NEBU: 3.0000 mL | INHALATION_SOLUTION | Freq: Four times a day (QID) | RESPIRATORY_TRACT | Status: DC | PRN

## 2017-10-30 MED ORDER — METHOCARBAMOL 500 MG PO TABS
ORAL_TABLET | ORAL | Status: AC
  Filled 2017-10-30: qty 1

## 2017-10-30 MED ORDER — METOCLOPRAMIDE HCL 5 MG PO TABS: 5.0000 mg | ORAL_TABLET | Freq: Three times a day (TID) | ORAL | Status: DC | PRN

## 2017-10-30 MED ORDER — CHLORHEXIDINE GLUCONATE 4 % EX LIQD: 60.0000 mL | Freq: Once | CUTANEOUS | Status: DC

## 2017-10-30 MED ORDER — ONDANSETRON HCL 4 MG/2ML IJ SOLN
INTRAMUSCULAR | Status: AC
  Filled 2017-10-30: qty 2

## 2017-10-30 MED ORDER — ERGOCALCIFEROL 1.25 MG (50000 UT) PO CAPS: 50000.0000 [IU] | ORAL_CAPSULE | ORAL | Status: DC

## 2017-10-30 MED ORDER — FENTANYL CITRATE (PF) 250 MCG/5ML IJ SOLN
INTRAMUSCULAR | Status: AC
  Filled 2017-10-30: qty 5

## 2017-10-30 MED ORDER — PROPOFOL 1000 MG/100ML IV EMUL
INTRAVENOUS | Status: AC
  Filled 2017-10-30: qty 100

## 2017-10-30 MED ORDER — POLYETHYLENE GLYCOL 3350 17 G PO PACK: 17.0000 g | PACK | Freq: Every day | ORAL | Status: DC | PRN

## 2017-10-30 MED ORDER — OXYCODONE HCL 5 MG PO TABS
10.0000 mg | ORAL_TABLET | ORAL | Status: DC | PRN
  Administered 2017-10-30 – 2017-11-01 (×11): 10 mg via ORAL
  Filled 2017-10-30 (×10): qty 2

## 2017-10-30 MED ORDER — PROPOFOL 10 MG/ML IV BOLUS
INTRAVENOUS | Status: AC
  Filled 2017-10-30: qty 20

## 2017-10-30 MED ORDER — FLUOXETINE HCL 40 MG PO CAPS: 40.0000 mg | ORAL_CAPSULE | Freq: Every day | ORAL | Status: DC

## 2017-10-30 MED ORDER — MONTELUKAST SODIUM 10 MG PO TABS
10.0000 mg | ORAL_TABLET | Freq: Every day | ORAL | Status: DC
  Administered 2017-10-30 – 2017-10-31 (×2): 10 mg via ORAL
  Filled 2017-10-30 (×2): qty 1

## 2017-10-30 MED ORDER — DEXTROSE 5 % IV SOLN
500.0000 mg | Freq: Four times a day (QID) | INTRAVENOUS | Status: DC | PRN
  Filled 2017-10-30: qty 5

## 2017-10-30 MED ORDER — FENTANYL CITRATE (PF) 100 MCG/2ML IJ SOLN
INTRAMUSCULAR | Status: AC
  Administered 2017-10-30: 50 ug via INTRAVENOUS
  Filled 2017-10-30: qty 2

## 2017-10-30 MED ORDER — FLUOXETINE HCL 20 MG PO CAPS
40.0000 mg | ORAL_CAPSULE | Freq: Every day | ORAL | Status: DC
  Administered 2017-10-31 – 2017-11-01 (×2): 40 mg via ORAL
  Filled 2017-10-30 (×3): qty 2

## 2017-10-30 MED ORDER — PHENYLEPHRINE HCL 10 MG/ML IJ SOLN
INTRAMUSCULAR | Status: DC | PRN
  Administered 2017-10-30: 25 ug/min via INTRAVENOUS

## 2017-10-30 MED ORDER — FENTANYL CITRATE (PF) 250 MCG/5ML IJ SOLN
INTRAMUSCULAR | Status: DC | PRN
  Administered 2017-10-30: 100 ug via INTRAVENOUS
  Administered 2017-10-30: 50 ug via INTRAVENOUS

## 2017-10-30 MED ORDER — PROPOFOL 500 MG/50ML IV EMUL
INTRAVENOUS | Status: DC | PRN
  Administered 2017-10-30: 75 ug/kg/min via INTRAVENOUS

## 2017-10-30 MED ORDER — SODIUM CHLORIDE 0.9 % IV SOLN
INTRAVENOUS | Status: DC
  Administered 2017-10-30: 17:00:00 via INTRAVENOUS

## 2017-10-30 MED ORDER — METOCLOPRAMIDE HCL 5 MG/ML IJ SOLN: 5.0000 mg | Freq: Three times a day (TID) | INTRAMUSCULAR | Status: DC | PRN

## 2017-10-30 MED ORDER — FLUTICASONE PROPIONATE 50 MCG/ACT NA SUSP: 1.0000 | Freq: Two times a day (BID) | NASAL | Status: DC | PRN

## 2017-10-30 MED ORDER — SODIUM CHLORIDE 0.9 % IV SOLN: INTRAVENOUS | Status: DC

## 2017-10-30 MED ORDER — TRANEXAMIC ACID 1000 MG/10ML IV SOLN
INTRAVENOUS | Status: AC | PRN
  Administered 2017-10-30: 2000 mg via TOPICAL

## 2017-10-30 MED ORDER — HYDROCHLOROTHIAZIDE 25 MG PO TABS
25.0000 mg | ORAL_TABLET | Freq: Every day | ORAL | Status: DC
  Filled 2017-10-30 (×2): qty 1

## 2017-10-30 MED ORDER — METHOCARBAMOL 500 MG PO TABS
500.0000 mg | ORAL_TABLET | Freq: Four times a day (QID) | ORAL | Status: DC | PRN
  Administered 2017-10-30 – 2017-11-01 (×7): 500 mg via ORAL
  Filled 2017-10-30 (×7): qty 1

## 2017-10-30 MED ORDER — ALUM & MAG HYDROXIDE-SIMETH 200-200-20 MG/5ML PO SUSP: 30.0000 mL | ORAL | Status: DC | PRN

## 2017-10-30 MED ORDER — LORATADINE 10 MG PO TABS
10.0000 mg | ORAL_TABLET | Freq: Every day | ORAL | Status: DC
  Administered 2017-10-30 – 2017-11-01 (×3): 10 mg via ORAL
  Filled 2017-10-30 (×4): qty 1

## 2017-10-30 MED ORDER — OXYBUTYNIN CHLORIDE ER 10 MG PO TB24
10.0000 mg | ORAL_TABLET | Freq: Every day | ORAL | Status: DC
  Administered 2017-10-30 – 2017-10-31 (×2): 10 mg via ORAL
  Filled 2017-10-30 (×2): qty 1

## 2017-10-30 MED ORDER — FENTANYL CITRATE (PF) 100 MCG/2ML IJ SOLN
25.0000 ug | INTRAMUSCULAR | Status: DC | PRN
  Administered 2017-10-30 (×2): 50 ug via INTRAVENOUS

## 2017-10-30 MED ORDER — 0.9 % SODIUM CHLORIDE (POUR BTL) OPTIME
TOPICAL | Status: DC | PRN
  Administered 2017-10-30: 1000 mL

## 2017-10-30 MED ORDER — LISINOPRIL-HYDROCHLOROTHIAZIDE 20-25 MG PO TABS: 1.0000 | ORAL_TABLET | Freq: Every day | ORAL | Status: DC

## 2017-10-30 MED ORDER — CEFAZOLIN SODIUM-DEXTROSE 2-4 GM/100ML-% IV SOLN
2.0000 g | INTRAVENOUS | Status: AC
  Administered 2017-10-30: 2 g via INTRAVENOUS

## 2017-10-30 MED ORDER — LACTATED RINGERS IV SOLN
INTRAVENOUS | Status: DC | PRN
  Administered 2017-10-30 (×3): via INTRAVENOUS

## 2017-10-30 MED ORDER — MAGNESIUM CITRATE PO SOLN: 1.0000 | Freq: Once | ORAL | Status: DC | PRN

## 2017-10-30 MED ORDER — ONDANSETRON HCL 4 MG/2ML IJ SOLN
INTRAMUSCULAR | Status: DC | PRN
  Administered 2017-10-30: 4 mg via INTRAVENOUS

## 2017-10-30 MED ORDER — SODIUM CHLORIDE 0.9 % IR SOLN
Status: DC | PRN
  Administered 2017-10-30: 3000 mL

## 2017-10-30 MED ORDER — KETOROLAC TROMETHAMINE 15 MG/ML IJ SOLN
15.0000 mg | Freq: Four times a day (QID) | INTRAMUSCULAR | Status: AC
  Administered 2017-10-30 – 2017-10-31 (×4): 15 mg via INTRAVENOUS
  Filled 2017-10-30 (×4): qty 1

## 2017-10-30 MED ORDER — CYANOCOBALAMIN 1000 MCG/ML IJ SOLN: 1000.0000 ug | INTRAMUSCULAR | Status: DC

## 2017-10-30 MED ORDER — OXYCODONE HCL 5 MG PO TABS
ORAL_TABLET | ORAL | Status: AC
  Filled 2017-10-30: qty 2

## 2017-10-30 MED ORDER — CEFAZOLIN SODIUM-DEXTROSE 2-4 GM/100ML-% IV SOLN
2.0000 g | Freq: Four times a day (QID) | INTRAVENOUS | Status: AC
  Administered 2017-10-30 – 2017-10-31 (×2): 2 g via INTRAVENOUS
  Filled 2017-10-30 (×3): qty 100

## 2017-10-30 MED ORDER — MENTHOL 3 MG MT LOZG: 1.0000 | LOZENGE | OROMUCOSAL | Status: DC | PRN

## 2017-10-30 SURGICAL SUPPLY — 61 items
BAG DECANTER FOR FLEXI CONT (MISCELLANEOUS) ×3 IMPLANT
BANDAGE ESMARK 6X9 LF (GAUZE/BANDAGES/DRESSINGS) ×1 IMPLANT
BLADE SAGITTAL 25.0X1.19X90 (BLADE) ×2 IMPLANT
BLADE SAGITTAL 25.0X1.19X90MM (BLADE) ×1
BNDG CMPR 9X6 STRL LF SNTH (GAUZE/BANDAGES/DRESSINGS) ×1
BNDG ESMARK 6X9 LF (GAUZE/BANDAGES/DRESSINGS) ×3
BOWL SMART MIX CTS (DISPOSABLE) ×3 IMPLANT
CAP KNEE TOTAL 3 SIGMA ×2 IMPLANT
CEMENT HV SMART SET (Cement) ×6 IMPLANT
COVER SURGICAL LIGHT HANDLE (MISCELLANEOUS) ×3 IMPLANT
CUFF TOURNIQUET SINGLE 34IN LL (TOURNIQUET CUFF) ×3 IMPLANT
DECANTER SPIKE VIAL GLASS SM (MISCELLANEOUS) ×3 IMPLANT
DRAPE EXTREMITY T 121X128X90 (DRAPE) ×3 IMPLANT
DRAPE HALF SHEET 40X57 (DRAPES) ×4 IMPLANT
DRSG ADAPTIC 3X8 NADH LF (GAUZE/BANDAGES/DRESSINGS) ×3 IMPLANT
DRSG PAD ABDOMINAL 8X10 ST (GAUZE/BANDAGES/DRESSINGS) ×4 IMPLANT
DURAPREP 26ML APPLICATOR (WOUND CARE) ×6 IMPLANT
ELECT CAUTERY BLADE 6.4 (BLADE) ×3 IMPLANT
ELECT REM PT RETURN 9FT ADLT (ELECTROSURGICAL) ×3
ELECTRODE REM PT RTRN 9FT ADLT (ELECTROSURGICAL) ×1 IMPLANT
FACESHIELD WRAPAROUND (MASK) ×6 IMPLANT
FACESHIELD WRAPAROUND OR TEAM (MASK) ×2 IMPLANT
GAUZE SPONGE 4X4 12PLY STRL (GAUZE/BANDAGES/DRESSINGS) ×3 IMPLANT
GAUZE SPONGE 4X4 12PLY STRL LF (GAUZE/BANDAGES/DRESSINGS) ×2 IMPLANT
GLOVE BIOGEL PI IND STRL 8 (GLOVE) ×1 IMPLANT
GLOVE BIOGEL PI IND STRL 8.5 (GLOVE) ×1 IMPLANT
GLOVE BIOGEL PI INDICATOR 8 (GLOVE) ×2
GLOVE BIOGEL PI INDICATOR 8.5 (GLOVE) ×2
GLOVE ECLIPSE 8.0 STRL XLNG CF (GLOVE) ×6 IMPLANT
GLOVE SURG ORTHO 8.5 STRL (GLOVE) ×6 IMPLANT
GOWN STRL REUS W/ TWL LRG LVL3 (GOWN DISPOSABLE) ×2 IMPLANT
GOWN STRL REUS W/TWL 2XL LVL3 (GOWN DISPOSABLE) ×3 IMPLANT
GOWN STRL REUS W/TWL LRG LVL3 (GOWN DISPOSABLE) ×6
HANDPIECE INTERPULSE COAX TIP (DISPOSABLE) ×3
KIT BASIN OR (CUSTOM PROCEDURE TRAY) ×3 IMPLANT
KIT ROOM TURNOVER OR (KITS) ×3 IMPLANT
MANIFOLD NEPTUNE II (INSTRUMENTS) ×3 IMPLANT
NEEDLE 22X1 1/2 (OR ONLY) (NEEDLE) ×3 IMPLANT
NS IRRIG 1000ML POUR BTL (IV SOLUTION) ×3 IMPLANT
PACK TOTAL JOINT (CUSTOM PROCEDURE TRAY) ×3 IMPLANT
PAD ARMBOARD 7.5X6 YLW CONV (MISCELLANEOUS) ×6 IMPLANT
PAD CAST 4YDX4 CTTN HI CHSV (CAST SUPPLIES) ×1 IMPLANT
PADDING CAST COTTON 4X4 STRL (CAST SUPPLIES) ×3
PADDING CAST COTTON 6X4 STRL (CAST SUPPLIES) ×3 IMPLANT
SET HNDPC FAN SPRY TIP SCT (DISPOSABLE) ×1 IMPLANT
SPONGE LAP 18X18 X RAY DECT (DISPOSABLE) ×2 IMPLANT
STAPLER VISISTAT 35W (STAPLE) ×3 IMPLANT
SUCTION FRAZIER HANDLE 10FR (MISCELLANEOUS) ×2
SUCTION TUBE FRAZIER 10FR DISP (MISCELLANEOUS) ×1 IMPLANT
SURGIFLO W/THROMBIN 8M KIT (HEMOSTASIS) IMPLANT
SUT BONE WAX W31G (SUTURE) ×3 IMPLANT
SUT ETHIBOND NAB CT1 #1 30IN (SUTURE) ×6 IMPLANT
SUT MNCRL AB 3-0 PS2 18 (SUTURE) ×3 IMPLANT
SUT VIC AB 0 CT1 27 (SUTURE) ×3
SUT VIC AB 0 CT1 27XBRD ANBCTR (SUTURE) ×1 IMPLANT
SYR CONTROL 10ML LL (SYRINGE) IMPLANT
TOWEL OR 17X24 6PK STRL BLUE (TOWEL DISPOSABLE) ×3 IMPLANT
TOWEL OR 17X26 10 PK STRL BLUE (TOWEL DISPOSABLE) ×3 IMPLANT
TRAY FOLEY BAG SILVER LF 16FR (SET/KITS/TRAYS/PACK) ×3 IMPLANT
TRAY REVISION SZ 2.5 (Knees) ×2 IMPLANT
WRAP KNEE MAXI GEL POST OP (GAUZE/BANDAGES/DRESSINGS) ×3 IMPLANT

## 2017-10-30 NOTE — Plan of Care (Signed)
  Elimination: Will not experience complications related to bowel motility 10/30/2017 1608 - Progressing by Darrow BussingArcilla, Lowen Mansouri M, RN   Pain Managment: General experience of comfort will improve 10/30/2017 1608 - Progressing by Darrow BussingArcilla, Alivia Cimino M, RN   Safety: Ability to remain free from injury will improve 10/30/2017 1608 - Progressing by Darrow BussingArcilla, Christinna Sprung M, RN

## 2017-10-30 NOTE — Evaluation (Signed)
Physical Therapy Evaluation Patient Details Name: Danielle Schmitt L Crom MRN: [id number] DOB: 01/04/1956 Today's Date: 10/30/2017   History of Present Illness  Pt is a 61 y/o female s/p elective R TKA. PMH includes HTN, back surgery, DM, asthma, and L TKA and L THA.   Clinical Impression  Pt s/p surgery above with deficits below. PTA, pt was independent with functional mobility and reports occasional use of cane. Upon eval, pt limited by post op pain and weakness, and required min guard for mobility. Gait distance limited to chair this session. Reports friend and neighbor will be available intermittently upon d/c. Pt reports she has all necessary DME. Follow up recommendations per MD arrangements. Will continue to follow acutely to maximize functional mobility independence and safety.     Follow Up Recommendations DC plan and follow up therapy as arranged by surgeon;Supervision for mobility/OOB    Equipment Recommendations  None recommended by PT(has all necessary DME)    Recommendations for Other Services       Precautions / Restrictions Precautions Precautions: Knee Precaution Booklet Issued: Yes (comment) Precaution Comments: Reviewed precautions and supine ther ex with pt.  Restrictions Weight Bearing Restrictions: Yes RLE Weight Bearing: Partial weight bearing RLE Partial Weight Bearing Percentage or Pounds: 50      Mobility  Bed Mobility Overal bed mobility: Needs Assistance Bed Mobility: Supine to Sit     Supine to sit: Supervision     General bed mobility comments: Supervision for safety. Use of bed rails and elevated HOB.   Transfers Overall transfer level: Needs assistance Equipment used: Rolling walker (2 wheeled) Transfers: Sit to/from Stand Sit to Stand: Min guard         General transfer comment: Min guard for safety.   Ambulation/Gait Ambulation/Gait assistance: Min guard Ambulation Distance (Feet): 5 Feet Assistive device: Rolling walker (2 wheeled) Gait  Pattern/deviations: Step-to pattern;Decreased step length - right;Decreased step length - left;Decreased weight shift to right;Antalgic Gait velocity: Decreased Gait velocity interpretation: Below normal speed for age/gender General Gait Details: Slow, antalgic gait. Verbal cues for pressing through UEs to maintain 50% WB on RLE. Distance limited secondary to pain. Verbal cues for sequencing using RW   Stairs            Wheelchair Mobility    Modified Rankin (Stroke Patients Only)       Balance Overall balance assessment: Needs assistance Sitting-balance support: No upper extremity supported;Feet supported Sitting balance-Leahy Scale: Good     Standing balance support: Bilateral upper extremity supported;During functional activity Standing balance-Leahy Scale: Poor Standing balance comment: Reliant on RW for stability                              Pertinent Vitals/Pain Pain Assessment: 0-10 Pain Score: 3  Pain Location: R knee  Pain Descriptors / Indicators: Aching;Operative site guarding Pain Intervention(s): Limited activity within patient's tolerance;Monitored during session;Repositioned    Home Living Family/patient expects to be discharged to:: Private residence Living Arrangements: Other (Comment)(friend Velna HatchetSheila) Available Help at Discharge: Family;Neighbor;Available PRN/intermittently Type of Home: House Home Access: Stairs to enter Entrance Stairs-Rails: None Entrance Stairs-Number of Steps: 2 Home Layout: Two level;Laundry or work area in basement;Able to live on main level with bedroom/bathroom Home Equipment: Bedside commode;Walker - 2 wheels;Cane - quad;Grab bars - tub/shower      Prior Function Level of Independence: Independent with assistive device(s)         Comments: Used can sometimes depending  on pain      Hand Dominance   Dominant Hand: Right    Extremity/Trunk Assessment   Upper Extremity Assessment Upper Extremity  Assessment: Defer to OT evaluation    Lower Extremity Assessment Lower Extremity Assessment: RLE deficits/detail RLE Deficits / Details: Sensory in tact. Deficits consistent with post op pain and weakness. Able to perform ther ex below.     Cervical / Trunk Assessment Cervical / Trunk Assessment: Normal  Communication   Communication: No difficulties  Cognition Arousal/Alertness: Awake/alert Behavior During Therapy: WFL for tasks assessed/performed Overall Cognitive Status: Within Functional Limits for tasks assessed                                        General Comments      Exercises Total Joint Exercises Ankle Circles/Pumps: AROM;Both;20 reps Quad Sets: AROM;Right;10 reps Towel Squeeze: AROM;Both;10 reps Short Arc Quad: AROM;Right;10 reps Hip ABduction/ADduction: AROM;Right;10 reps   Assessment/Plan    PT Assessment Patient needs continued PT services  PT Problem List Decreased strength;Decreased range of motion;Decreased balance;Decreased mobility;Decreased knowledge of use of DME;Decreased knowledge of precautions;Pain       PT Treatment Interventions DME instruction;Gait training;Stair training;Functional mobility training;Therapeutic activities;Therapeutic exercise;Balance training;Neuromuscular re-education;Patient/family education    PT Goals (Current goals can be found in the Care Plan section)  Acute Rehab PT Goals Patient Stated Goal: to go home  PT Goal Formulation: With patient Time For Goal Achievement: 11/06/17 Potential to Achieve Goals: Good    Frequency 7X/week   Barriers to discharge        Co-evaluation               AM-PAC PT "6 Clicks" Daily Activity  Outcome Measure Difficulty turning over in bed (including adjusting bedclothes, sheets and blankets)?: None Difficulty moving from lying on back to sitting on the side of the bed? : None Difficulty sitting down on and standing up from a chair with arms (e.g.,  wheelchair, bedside commode, etc,.)?: Unable Help needed moving to and from a bed to chair (including a wheelchair)?: A Little Help needed walking in hospital room?: A Little Help needed climbing 3-5 steps with a railing? : A Lot 6 Click Score: 17    End of Session Equipment Utilized During Treatment: Gait belt Activity Tolerance: Patient limited by pain Patient left: in chair;with call bell/phone within reach Nurse Communication: Mobility status PT Visit Diagnosis: Other abnormalities of gait and mobility (R26.89);Pain Pain - Right/Left: Right Pain - part of body: Knee    Time: [id number] PT Time Calculation (min) (ACUTE ONLY): 20 min   Charges:   PT Evaluation $PT Eval Low Complexity: 1 Low     PT G Codes:        Gladys DammeBrittany Cariah Salatino, PT, DPT  Acute Rehabilitation Services  Pager: [phone]   Lehman PromBrittany S Adrina Armijo 10/30/2017, 6:28 PM

## 2017-10-30 NOTE — Progress Notes (Signed)
Orthopedic Tech Progress Note Patient Details:  Danielle Schmitt 11/26/1956 [id number]  CPM Right Knee CPM Right Knee: On Right Knee Flexion (Degrees): 90 Right Knee Extension (Degrees): 0   [name] 10/30/2017, 10:45 AM Trapeze bar patient helper not applied because pt's weight exceeds durability of frame; RN notified

## 2017-10-30 NOTE — Anesthesia Postprocedure Evaluation (Signed)
Anesthesia Post Note  Patient: Danielle Schmitt  Procedure(s) Performed: RIGHT TOTAL KNEE ARTHROPLASTY (Right Knee)     Patient location during evaluation: PACU Anesthesia Type: Spinal Level of consciousness: awake Pain management: satisfactory to patient Vital Signs Assessment: post-procedure vital signs reviewed and stable Respiratory status: spontaneous breathing Cardiovascular status: blood pressure returned to baseline Postop Assessment: no headache and spinal receding Anesthetic complications: no    Last Vitals:  Vitals:   10/30/17 1240 10/30/17 1310  BP: 105/77 117/66  Pulse: 90 78  Resp: 17 17  Temp:    SpO2: 92% 96%    Last Pain:  Vitals:   10/30/17 1220  TempSrc:   PainSc: 3                  Raffaele Derise EDWARD

## 2017-10-30 NOTE — H&P (Signed)
The recent History & Physical has been reviewed. I have personally examined the patient today. There is no interval change to the documented History & Physical. The patient would like to proceed with the procedure.  Valeria Batmaneter W Ahsley Attwood 10/30/2017,  7:09 AM

## 2017-10-30 NOTE — Op Note (Signed)
PATIENT ID:      Danielle Schmitt  MRN:     [id number] DOB/AGE:    03/27/1956 / 61 y.o.       OPERATIVE REPORT    DATE OF PROCEDURE:  10/30/2017       PREOPERATIVE DIAGNOSIS:END STAGE   RIGHT KNEE OSTEOARTHRITIS                                                       Estimated body mass index is 38.97 kg/m as calculated from the following:   Height as of 10/24/17: 5\' 3"  (1.6 m).   Weight as of this encounter: 220 lb (99.8 kg).     POSTOPERATIVE DIAGNOSIS: END STAGE  RIGHT KNEE OSTEOARTHRITIS                                                                     Estimated body mass index is 38.97 kg/m as calculated from the following:   Height as of 10/24/17: 5\' 3"  (1.6 m).   Weight as of this encounter: 220 lb (99.8 kg).     PROCEDURE:  Procedure(s): RIGHT TOTAL KNEE ARTHROPLASTY     SURGEON:  Norlene CampbellPeter Luisa Louk, MD    ASSISTANT:   Jacqualine CodeBrian Petrarca, PA-C   (Present and scrubbed throughout the case, critical for assistance with exposure, retraction, instrumentation, and closure.)          ANESTHESIA: regional, spinal and IV sedation     DRAINS: none :      TOURNIQUET TIME:  Total Tourniquet Time Documented: Thigh (Right) - 85 minutes Total: Thigh (Right) - 85 minutes     COMPLICATIONS:  None   CONDITION:  stable  PROCEDURE IN DETAIL: [id number]   Danielle Schmitt 10/30/2017, 9:39 AM

## 2017-10-30 NOTE — Anesthesia Procedure Notes (Deleted)
Spinal

## 2017-10-30 NOTE — Transfer of Care (Signed)
Immediate Anesthesia Transfer of Care Note  Patient: Danielle Schmitt  Procedure(s) Performed: RIGHT TOTAL KNEE ARTHROPLASTY (Right Knee)  Patient Location: PACU  Anesthesia Type:Spinal  Level of Consciousness: drowsy and patient cooperative  Airway & Oxygen Therapy: Patient Spontanous Breathing and Patient connected to face mask oxygen  Post-op Assessment: Report given to RN and Post -op Vital signs reviewed and stable  Post vital signs: Reviewed and stable  Last Vitals:  Vitals:   10/30/17 0616 10/30/17 1009  BP: 123/72 102/69  Pulse: 77 90  Resp: 16 20  Temp: 36.9 C 36.6 C  SpO2: 97% 100%    Last Pain:  Vitals:   10/30/17 1009  TempSrc:   PainSc: (P) 0-No pain      Patients Stated Pain Goal: 2 (10/30/17 [id number])  Complications: No apparent anesthesia complications

## 2017-10-30 NOTE — Anesthesia Procedure Notes (Signed)
Spinal  Patient location during procedure: OR Staffing Anesthesiologist: Cristela BlueJackson, Deirdre Gryder, MD Spinal Block Patient position: sitting Prep: DuraPrep Patient monitoring: heart rate, blood pressure and continuous pulse ox Approach: right paramedian Location: L3-4 Injection technique: single-shot Needle Needle type: Sprotte  Needle gauge: 24 G Needle length: 9 cm Needle insertion depth: 6 cm Assessment Sensory level: T4 Additional Notes Spinal Dosage in OR  .75% Bupivicaine ml       1.6  RLD x 3 min  sprotte thru [email or web address]@ 6 cm LOR

## 2017-10-31 ENCOUNTER — Encounter (HOSPITAL_COMMUNITY): Payer: Self-pay | Admitting: Orthopaedic Surgery

## 2017-10-31 ENCOUNTER — Other Ambulatory Visit: Payer: Self-pay

## 2017-10-31 LAB — BASIC METABOLIC PANEL
ANION GAP: 7 (ref 5–15)
BUN: 15 mg/dL (ref 6–20)
CALCIUM: 8.2 mg/dL — AB (ref 8.9–10.3)
CO2: 24 mmol/L (ref 22–32)
Chloride: 102 mmol/L (ref 101–111)
Creatinine, Ser: 0.71 mg/dL (ref 0.44–1.00)
Glucose, Bld: 110 mg/dL — ABNORMAL HIGH (ref 65–99)
POTASSIUM: 4.1 mmol/L (ref 3.5–5.1)
SODIUM: 133 mmol/L — AB (ref 135–145)

## 2017-10-31 LAB — CBC
HCT: 28.6 % — ABNORMAL LOW (ref 36.0–46.0)
Hemoglobin: 9.5 g/dL — ABNORMAL LOW (ref 12.0–15.0)
MCH: 29.4 pg (ref 26.0–34.0)
MCHC: 33.2 g/dL (ref 30.0–36.0)
MCV: 88.5 fL (ref 78.0–100.0)
PLATELETS: 280 10*3/uL (ref 150–400)
RBC: 3.23 MIL/uL — AB (ref 3.87–5.11)
RDW: 12.9 % (ref 11.5–15.5)
WBC: 8.2 10*3/uL (ref 4.0–10.5)

## 2017-10-31 MED ORDER — OXYCODONE HCL 5 MG PO TABS: 5.0000 mg | ORAL_TABLET | ORAL | 0 refills | Status: DC | PRN

## 2017-10-31 MED ORDER — ACETAMINOPHEN 500 MG PO TABS
1000.0000 mg | ORAL_TABLET | Freq: Once | ORAL | Status: AC
  Administered 2017-10-31: 1000 mg via ORAL
  Filled 2017-10-31: qty 2

## 2017-10-31 MED ORDER — RIVAROXABAN 10 MG PO TABS: 10.0000 mg | ORAL_TABLET | Freq: Every day | ORAL | 0 refills | Status: DC

## 2017-10-31 MED ORDER — ACETAMINOPHEN 325 MG PO TABS
650.0000 mg | ORAL_TABLET | Freq: Four times a day (QID) | ORAL | Status: DC | PRN
  Administered 2017-10-31 – 2017-11-01 (×3): 650 mg via ORAL
  Filled 2017-10-31 (×3): qty 2

## 2017-10-31 NOTE — Discharge Summary (Signed)
Danielle Campbell, MD   [name], PA-C [address] [place], [place]  [address]                             [phone]  PATIENT ID: [name]        MRN:  [id number]          DOB/AGE: 1956-07-06 / 61 y.o.    DISCHARGE SUMMARY  ADMISSION DATE:    10/30/2017 DISCHARGE DATE:   11/01/2017  ADMISSION DIAGNOSIS: RIGHT KNEE OSTEOARTHRITIS    DISCHARGE DIAGNOSIS:  RIGHT KNEE OSTEOARTHRITIS    ADDITIONAL DIAGNOSIS: Principal Problem:   Primary osteoarthritis of right knee Active Problems:   S/P TKR (total knee replacement) using cement, right  Past Medical History:  Diagnosis Date  . Anemia   . Asthma   . B12 deficiency    Hx: of  . Complication of anesthesia    Hx: of bronchospasm attack during procedure- hystrectomy  . Degenerative joint disease   . Depression   . Environmental allergies    Hx : of  . History of blood transfusion    knee replacement and hip replacement  . Hypertension   . Incontinence of urine    "due to vaginal hysterectopmy  . Pneumonia    2014  . Spinal headache    ;Hx: of headache with Epidural; migraines    PROCEDURE: Procedure(s): RIGHT TOTAL KNEE ARTHROPLASTY Right on 10/30/2017  CONSULTS: none    HISTORY: Danielle Schmitt, 61 y.o. female, has a history of pain and functional disability in the right knee due to arthritis and has failed non-surgical conservative treatments for greater than 12 weeks to includeNSAID's and/or analgesics, corticosteriod injections, viscosupplementation injections, flexibility and strengthening excercises, use of assistive devices, weight reduction as appropriate and activity modification.  Onset of symptoms was gradual, starting >10 years ago with rapidlly worsening course since that time. The patient noted prior procedures on the knee to include  arthroscopy and menisectomy on the right knee(s).  Patient currently rates pain in the right knee(s) at 10 out of 10 with activity. Patient has night pain, worsening of  pain with activity and weight bearing, pain that interferes with activities of daily living, pain with passive range of motion, crepitus and joint swelling.  Patient has evidence of subchondral cysts, subchondral sclerosis, periarticular osteophytes, joint subluxation and joint space narrowing by imaging studies. There is no active infection.   HOSPITAL COURSE:  Danielle Schmitt is a 61 y.o. admitted on 10/30/2017 and found to have a diagnosis of RIGHT KNEE OSTEOARTHRITIS.  After appropriate laboratory studies were obtained  they were taken to the operating room on 10/30/2017 and underwent  Procedure(s): RIGHT TOTAL KNEE ARTHROPLASTY  .   They were given perioperative antibiotics:  Anti-infectives (From admission, onward)   Start     Dose/Rate Route Frequency Ordered Stop   10/30/17 1630  ceFAZolin (ANCEF) IVPB 2g/100 mL premix     2 g 200 mL/hr over 30 Minutes Intravenous Every 6 hours 10/30/17 1533 10/31/17 0155   10/30/17 0603  ceFAZolin (ANCEF) 2-4 GM/100ML-% IVPB    Comments:  Ray Church   : cabinet override      10/30/17 0603 10/30/17 0736   10/30/17 0600  ceFAZolin (ANCEF) IVPB 2g/100 mL premix     2 g 200 mL/hr over 30 Minutes Intravenous On call to O.R. 10/30/17 0600 10/30/17 0741    .  Tolerated the procedure well.  Placed with a foley intraoperatively.    Toradol was given post op.  POD #1, allowed out of bed to a chair.  PT for ambulation and exercise program.  Foley D/C'd in morning.  IV saline locked.  O2 discontionued.  POD #2, continued PT and ambulation.   .  The remainder of the hospital course was dedicated to ambulation and strengthening.   The patient was discharged on 1 Day Post-Op in  Stable condition.  Blood products given:none  DIAGNOSTIC STUDIES: Recent vital signs:  Patient Vitals for the past 24 hrs:  BP Temp Temp src Pulse Resp SpO2  10/31/17 0528 (!) 111/54 98.1 F (36.7 C) Oral 79 16 95 %  10/30/17 2006 (!) 117/58 98.1 F (36.7 C) Oral 72 16 94 %    10/30/17 1526 105/82 98.4 F (36.9 C) Oral 100 16 92 %  10/30/17 1510 102/64 98 F (36.7 C) - 90 15 92 %  10/30/17 1440 109/63 - - 91 18 97 %  10/30/17 1410 100/60 - - 85 11 96 %  10/30/17 1340 118/60 - - 83 13 93 %  10/30/17 1310 117/66 - - 78 17 96 %  10/30/17 1240 105/77 - - 90 17 92 %  10/30/17 1210 115/60 - - 72 12 94 %  10/30/17 1140 106/65 - - 66 13 94 %  10/30/17 1110 (!) 107/57 - - 74 15 92 %  10/30/17 1055 112/64 - - 81 20 95 %  10/30/17 1040 122/75 - - 85 16 94 %  10/30/17 1024 (!) 110/55 - - 83 15 92 %  10/30/17 1009 102/69 97.8 F (36.6 C) - 90 20 100 %       Recent laboratory studies: Recent Labs    10/31/17 0426  WBC 8.2  HGB 9.5*  HCT 28.6*  PLT 280   Recent Labs    10/31/17 0426  NA 133*  K 4.1  CL 102  CO2 24  BUN 15  CREATININE 0.71  GLUCOSE 110*  CALCIUM 8.2*   Lab Results  Component Value Date   INR 0.96 10/19/2017   INR 0.92 06/11/2013   INR 0.95 01/10/2010     Recent Radiographic Studies :  Dg Chest 2 View  Result Date: 10/19/2017 CLINICAL DATA:  Initial evaluation for preoperative evaluation. History of pneumonia, asthma. EXAM: CHEST  2 VIEW COMPARISON:  Prior radiograph from 06/18/2013. FINDINGS: Cardiac and mediastinal silhouettes are stable in size and contour, and remain within normal limits. Elevation of the right hemidiaphragm, similar to previous, likely chronic. Patchy right basilar opacity favored to reflect atelectasis, although infiltrate could be considered in the correct clinical setting. Underlying scattered diffuse peribronchial thickening suggestive of changes related asthma and/ or bronchiolitis. No pulmonary edema or pleural effusion. No pneumothorax. No acute osseus abnormality. IMPRESSION: 1. Chronic elevation of the right hemidiaphragm. Associated right basilar opacity likely reflects atelectasis/bronchovascular crowding. Infectious infiltrate could be considered in the correct clinical setting. 2. Underlying scattered  peribronchial thickening, suspected be related history of asthma. Active bronchiolitis could also be considered. Electronically Signed   By: Rise MuBenjamin  McClintock M.D.   On: 10/19/2017 15:47    DISCHARGE INSTRUCTIONS: Discharge Instructions    CPM   Complete by:  As directed    Continuous passive motion machine (CPM):      Use the CPM from 0 to 60 for 6-8 hours per day.      You may increase by 5-10 degrees per day.  You may break it up into  2 or 3 sessions per day.      Use CPM for 3-4  weeks or until you are told to stop.   Call MD / Call 911   Complete by:  As directed    If you experience chest pain or shortness of breath, CALL 911 and be transported to the hospital emergency room.  If you develope a fever above 101 F, pus (white drainage) or increased drainage or redness at the wound, or calf pain, call your surgeon's office.   Change dressing   Complete by:  As directed    DO NOT CHANGE YOUR DRESSING   Constipation Prevention   Complete by:  As directed    Drink plenty of fluids.  Prune juice may be helpful.  You may use a stool softener, such as Colace (over the counter) 100 mg twice a day.  Use MiraLax (over the counter) for constipation as needed.   Diet general   Complete by:  As directed    Discharge instructions   Complete by:  As directed    INSTRUCTIONS AFTER JOINT REPLACEMENT   Remove items at home which could result in a fall. This includes throw rugs or furniture in walking pathways ICE to the affected joint every three hours while awake for 30 minutes at a time, for at least the first 3-5 days, and then as needed for pain and swelling.  Continue to use ice for pain and swelling. You may notice swelling that will progress down to the foot and ankle.  This is normal after surgery.  Elevate your leg when you are not up walking on it.   Continue to use the breathing machine you got in the hospital (incentive spirometer) which will help keep your temperature down.  It is  common for your temperature to cycle up and down following surgery, especially at night when you are not up moving around and exerting yourself.  The breathing machine keeps your lungs expanded and your temperature down.   DIET:  As you were doing prior to hospitalization, we recommend a well-balanced diet.  DRESSING / WOUND CARE / SHOWERING  Keep the surgical dressing until follow up.  The dressing is water proof, so you can shower without any extra covering.  IF THE DRESSING FALLS OFF or the wound gets wet inside, change the dressing with sterile gauze.  Please use good hand washing techniques before changing the dressing.  Do not use any lotions or creams on the incision until instructed by your surgeon.    ACTIVITY  Increase activity slowly as tolerated, but follow the weight bearing instructions below.   No driving for 6 weeks or until further direction given by your physician.  You cannot drive while taking narcotics.  No lifting or carrying greater than 10 lbs. until further directed by your surgeon. Avoid periods of inactivity such as sitting longer than an hour when not asleep. This helps prevent blood clots.  You may return to work once you are authorized by your doctor.     WEIGHT BEARING   Partial weight bearing with assist device as directed.  50%   EXERCISES  Results after joint replacement surgery are often greatly improved when you follow the exercise, range of motion and muscle strengthening exercises prescribed by your doctor. Safety measures are also important to protect the joint from further injury. Any time any of these exercises cause you to have increased pain or swelling, decrease what you are doing until you are comfortable  again and then slowly increase them. If you have problems or questions, call your caregiver or physical therapist for advice.   Rehabilitation is important following a joint replacement. After just a few days of immobilization, the muscles of  the leg can become weakened and shrink (atrophy).  These exercises are designed to build up the tone and strength of the thigh and leg muscles and to improve motion. Often times heat used for twenty to thirty minutes before working out will loosen up your tissues and help with improving the range of motion but do not use heat for the first two weeks following surgery (sometimes heat can increase post-operative swelling).   These exercises can be done on a training (exercise) mat, on the floor, on a table or on a bed. Use whatever works the best and is most comfortable for you.    Use music or television while you are exercising so that the exercises are a pleasant break in your day. This will make your life better with the exercises acting as a break in your routine that you can look forward to.   Perform all exercises about fifteen times, three times per day or as directed.  You should exercise both the operative leg and the other leg as well.   Exercises include:  Quad Sets - Tighten up the muscle on the front of the thigh (Quad) and hold for 5-10 seconds.   Straight Leg Raises - With your knee straight (if you were given a brace, keep it on), lift the leg to 60 degrees, hold for 3 seconds, and slowly lower the leg.  Perform this exercise against resistance later as your leg gets stronger.  Leg Slides: Lying on your back, slowly slide your foot toward your buttocks, bending your knee up off the floor (only go as far as is comfortable). Then slowly slide your foot back down until your leg is flat on the floor again.  Angel Wings: Lying on your back spread your legs to the side as far apart as you can without causing discomfort.  Hamstring Strength:  Lying on your back, push your heel against the floor with your leg straight by tightening up the muscles of your buttocks.  Repeat, but this time bend your knee to a comfortable angle, and push your heel against the floor.  You may put a pillow under the heel  to make it more comfortable if necessary.   A rehabilitation program following joint replacement surgery can speed recovery and prevent re-injury in the future due to weakened muscles. Contact your doctor or a physical therapist for more information on knee rehabilitation.    CONSTIPATION  Constipation is defined medically as fewer than three stools per week and severe constipation as less than one stool per week.  Even if you have a regular bowel pattern at home, your normal regimen is likely to be disrupted due to multiple reasons following surgery.  Combination of anesthesia, postoperative narcotics, change in appetite and fluid intake all can affect your bowels.   YOU MUST use at least one of the following options; they are listed in order of increasing strength to get the job done.  They are all available over the counter, and you may need to use some, POSSIBLY even all of these options:    Drink plenty of fluids (prune juice may be helpful) and high fiber foods Colace 100 mg by mouth twice a day  Senokot for constipation as directed and as needed Dulcolax (  bisacodyl), take with full glass of water  Miralax (polyethylene glycol) once or twice a day as needed.  If you have tried all these things and are unable to have a bowel movement in the first 3-4 days after surgery call either your surgeon or your primary doctor.    If you experience loose stools or diarrhea, hold the medications until you stool forms back up.  If your symptoms do not get better within 1 week or if they get worse, check with your doctor.  If you experience "the worst abdominal pain ever" or develop nausea or vomiting, please contact the office immediately for further recommendations for treatment.   ITCHING:  If you experience itching with your medications, try taking only a single pain pill, or even half a pain pill at a time.  You can also use Benadryl over the counter for itching or also to help with sleep.   TED  HOSE STOCKINGS:  Use stockings on both legs until for at least 2 weeks or as directed by physician office. They may be removed at night for sleeping.  MEDICATIONS:  See your medication summary on the "After Visit Summary" that nursing will review with you.  You may have some home medications which will be placed on hold until you complete the course of blood thinner medication.  It is important for you to complete the blood thinner medication as prescribed.  PRECAUTIONS:  If you experience chest pain or shortness of breath - call 911 immediately for transfer to the hospital emergency department.   If you develop a fever greater that 101 F, purulent drainage from wound, increased redness or drainage from wound, foul odor from the wound/dressing, or calf pain - CONTACT YOUR SURGEON.                                                   FOLLOW-UP APPOINTMENTS:  If you do not already have a post-op appointment, please call the office for an appointment to be seen by your surgeon.  Guidelines for how soon to be seen are listed in your "After Visit Summary", but are typically between 1-4 weeks after surgery.  OTHER INSTRUCTIONS:   Knee Replacement:  Do not place pillow under knee, focus on keeping the knee straight while resting. CPM instructions: 0-90 degrees, 2 hours in the morning, 2 hours in the afternoon, and 2 hours in the evening. Place foam block, curve side up under heel at all times except when in CPM or when walking.  DO NOT modify, tear, cut, or change the foam block in any way.  MAKE SURE YOU:  Understand these instructions.  Get help right away if you are not doing well or get worse.    Thank you for letting us be a part of your medical care team.  It is a privilege we respect greatly.  We hope these instructions will help you stay on track for a fast and full recovery!   Do not put a pillow under the knee. Place it under the heel.   Complete by:  As directed    Driving restrictions    Complete by:  As directed    No driving for 6 weeks   Increase activity slowly as tolerated   Complete by:  As directed    Lifting restrictions   Complete by:  As directed    No lifting for 6 weeks   Partial weight bearing   Complete by:  As directed    % Body Weight:  50%   Laterality:  right   Extremity:  Lower   Patient may shower   Complete by:  As directed    You may shower over the brown dressing   TED hose   Complete by:  As directed    Use stockings (TED hose) for 2-3 weeks on right leg.  You may remove them at night for sleeping.      DISCHARGE MEDICATIONS:   Allergies as of 10/31/2017      Reactions   Augmentin [amoxicillin-pot Clavulanate] Rash, Other (See Comments)   Allergic to the Pot-clavulante in the augmentin      Medication List    STOP taking these medications   celecoxib 200 MG capsule Commonly known as:  CELEBREX   HYDROcodone-acetaminophen 5-325 MG tablet Commonly known as:  NORCO/VICODIN   sulfamethoxazole-trimethoprim 800-160 MG tablet Commonly known as:  BACTRIM DS,SEPTRA DS     TAKE these medications   acetaminophen 500 MG tablet Commonly known as:  TYLENOL Take 1,000 mg by mouth every 6 (six) hours as needed for moderate pain or headache.   ALAWAY OP Place 1 drop into both eyes as needed (for allergies).   albuterol 108 (90 Base) MCG/ACT inhaler Commonly known as:  PROVENTIL HFA;VENTOLIN HFA Inhale 2 puffs into the lungs every 6 (six) hours as needed for wheezing.   cyanocobalamin 1000 MCG/ML injection Commonly known as:  (VITAMIN B-12) Inject 1,000 mcg into the muscle every 30 (thirty) days.   DSS 100 MG Caps Take 100 mg by mouth 2 (two) times daily.   ergocalciferol 50000 units capsule Commonly known as:  VITAMIN D2 Take 50,000 Units by mouth every Wednesday.   FLUoxetine 40 MG capsule Commonly known as:  PROZAC Take 40 mg by mouth daily.   fluticasone 50 MCG/ACT nasal spray Commonly known as:  FLONASE Place 1 spray  into both nostrils 2 (two) times daily as needed for allergies or rhinitis.   gabapentin 100 MG capsule Commonly known as:  NEURONTIN Take 100 mg by mouth at bedtime.   lisinopril-hydrochlorothiazide 20-25 MG tablet Commonly known as:  PRINZIDE,ZESTORETIC Take 1 tablet by mouth daily.   loratadine 10 MG tablet Commonly known as:  CLARITIN Take 10 mg by mouth daily.   methocarbamol 500 MG tablet Commonly known as:  ROBAXIN Take 1 tablet (500 mg total) by mouth every 6 (six) hours as needed. What changed:    how much to take  when to take this   montelukast 10 MG tablet Commonly known as:  SINGULAIR Take 10 mg by mouth at bedtime.   MULTIVITAMIN PO Take 1 tablet by mouth daily.   oxyCODONE 5 MG immediate release tablet Commonly known as:  Oxy IR/ROXICODONE Take 1-2 tablets (5-10 mg total) every 4 (four) hours as needed by mouth for moderate pain or severe pain ((score 4 to 6)). What changed:    when to take this  reasons to take this   rivaroxaban 10 MG Tabs tablet Commonly known as:  XARELTO Take 1 tablet (10 mg total) daily with breakfast by mouth. What changed:  when to take this   tolterodine 2 MG tablet Commonly known as:  DETROL Take 2 mg by mouth 2 (two) times daily.            Durable Medical Equipment  (From admission, onward)  Start     Ordered   10/30/17 1534  DME 3 n 1  Once     10/30/17 1533   10/30/17 1534  DME Bedside commode  Once    Question:  Patient needs a bedside commode to treat with the following condition  Answer:  S/P total knee arthroplasty, right   10/30/17 1533   10/30/17 1534  DME Walker rolling  Once    Question:  Patient needs a walker to treat with the following condition  Answer:  S/P total knee replacement using cement, left   10/30/17 1533       Discharge Care Instructions  (From admission, onward)        Start     Ordered   10/31/17 0000  Partial weight bearing    Question Answer Comment  % Body  Weight 50%   Laterality right   Extremity Lower      10/31/17 0804   10/31/17 0000  Change dressing    Comments:  DO NOT CHANGE YOUR DRESSING   10/31/17 0804      FOLLOW UP VISIT:   Follow-up Information    Valeria Batman, MD. Schedule an appointment as soon as possible for a visit on 11/14/2017.   Specialty:  Orthopedic Surgery Contact information: [address] [place] [address] [phone]           DISPOSITION:   Home  CONDITION:  Stable   Oris Drone. Aleda Grana [facility] [phone]  10/31/2017 8:10 AM

## 2017-10-31 NOTE — Plan of Care (Signed)
  Clinical Measurements: Ability to maintain clinical measurements within normal limits will improve 10/31/2017 1011 - Progressing by Darrow BussingArcilla, Marga Gramajo M, RN   Nutrition: Adequate nutrition will be maintained 10/31/2017 1011 - Progressing by Darrow BussingArcilla, Melea Prezioso M, RN   Safety: Ability to remain free from injury will improve 10/31/2017 1011 - Progressing by Darrow BussingArcilla, Severin Bou M, RN

## 2017-10-31 NOTE — Progress Notes (Signed)
OT Cancellation Note  Patient Details Name: Danielle Schmitt MRN: [id number] DOB: 12/21/1956   Cancelled Treatment:    Reason Eval/Treat Not Completed: OT screened, no needs identified, will sign off. Pt reports she has all necessary DME and AE.  She verbalized correct use of AE for LB ADLs tasks, including reacher and sock aid.  Pt will have necessary caregiver assist at home.  She reports that she does not feel that she needs OT at this time. Will discharge pt from OT. Please re-order to pt experiences decline in status. Thank you.  Cipriano MileJohnson, Jenna Elizabeth OTR/L 10/31/2017, 9:44 AM

## 2017-10-31 NOTE — Progress Notes (Signed)
PATIENT ID: Danielle Schmitt        MRN:  [id number]          DOB/AGE: 59/02/1956 / 59 y.o.    Danielle CampbellPeter Sanai Frick, MD   Danielle CodeBrian Petrarca, PA-C [address] [place], [place]  [id number]                             [phone]   PROGRESS NOTE  Subjective:  negative for Chest Pain  negative for Shortness of Breath  negative for Nausea/Vomiting   negative for Calf Pain    Tolerating Diet: yes         Patient reports pain as mild.     Nauseated with dilaudid-OK with oxycodone. Comfortable this am  Objective: Vital signs in last 24 hours:    Patient Vitals for the past 24 hrs:  BP Temp Temp src Pulse Resp SpO2  10/31/17 0528 (!) 111/54 98.1 F (36.7 C) Oral 79 16 95 %  10/30/17 2006 (!) 117/58 98.1 F (36.7 C) Oral 72 16 94 %  10/30/17 1526 105/82 98.4 F (36.9 C) Oral 100 16 92 %  10/30/17 1510 102/64 98 F (36.7 C) - 90 15 92 %  10/30/17 1440 109/63 - - 91 18 97 %  10/30/17 1410 100/60 - - 85 11 96 %  10/30/17 1340 118/60 - - 83 13 93 %  10/30/17 1310 117/66 - - 78 17 96 %  10/30/17 1240 105/77 - - 90 17 92 %  10/30/17 1210 115/60 - - 72 12 94 %  10/30/17 1140 106/65 - - 66 13 94 %  10/30/17 1110 (!) 107/57 - - 74 15 92 %  10/30/17 1055 112/64 - - 81 20 95 %  10/30/17 1040 122/75 - - 85 16 94 %  10/30/17 1024 (!) 110/55 - - 83 15 92 %  10/30/17 1009 102/69 97.8 F (36.6 C) - 90 20 100 %      Intake/Output from previous day:   11/06 0701 - 11/07 0700 In: 3700 [P.O.:720; I.V.:2500] Out: 1830 [Urine:1800]   Intake/Output this shift:   No intake/output data recorded.   Intake/Output      11/06 0701 - 11/07 0700 11/07 0701 - 11/08 0700   P.O. 720    I.V. (mL/kg) 2500 (25.1)    Other 480    Total Intake(mL/kg) 3700 (37.1)    Urine (mL/kg/hr) 1800 (0.8)    Blood 30    Total Output 1830    Net +1870            LABORATORY DATA: Recent Labs    10/31/17 0426  WBC 8.2  HGB 9.5*  HCT 28.6*  PLT 280   Recent Labs    10/31/17 0426  NA 133*  K 4.1  CL 102  CO2  24  BUN 15  CREATININE 0.71  GLUCOSE 110*  CALCIUM 8.2*   Lab Results  Component Value Date   INR 0.96 10/19/2017   INR 0.92 06/11/2013   INR 0.95 01/10/2010    Recent Radiographic Studies :  Dg Chest 2 View  Result Date: 10/19/2017 CLINICAL DATA:  Initial evaluation for preoperative evaluation. History of pneumonia, asthma. EXAM: CHEST  2 VIEW COMPARISON:  Prior radiograph from 06/18/2013. FINDINGS: Cardiac and mediastinal silhouettes are stable in size and contour, and remain within normal limits. Elevation of the right hemidiaphragm, similar to previous, likely chronic. Patchy right basilar opacity favored to reflect atelectasis,  although infiltrate could be considered in the correct clinical setting. Underlying scattered diffuse peribronchial thickening suggestive of changes related asthma and/ or bronchiolitis. No pulmonary edema or pleural effusion. No pneumothorax. No acute osseus abnormality. IMPRESSION: 1. Chronic elevation of the right hemidiaphragm. Associated right basilar opacity likely reflects atelectasis/bronchovascular crowding. Infectious infiltrate could be considered in the correct clinical setting. 2. Underlying scattered peribronchial thickening, suspected be related history of asthma. Active bronchiolitis could also be considered. Electronically Signed   By: Rise MuBenjamin  McClintock M.D.   On: 10/19/2017 15:47     Examination:  General appearance: alert, cooperative and no distress  Wound Exam: clean, dry, intact   Drainage:  None: wound tissue dry  Motor Exam: EHL, FHL, Anterior Tibial and Posterior Tibial Intact  Sensory Exam: Superficial Peroneal, Deep Peroneal and Tibial normal  Vascular Exam: Normal  Assessment:    1 Day Post-Op  Procedure(s) (LRB): RIGHT TOTAL KNEE ARTHROPLASTY (Right)  ADDITIONAL DIAGNOSIS:  Principal Problem:   Primary osteoarthritis of right knee Active Problems:   S/P TKR (total knee replacement) using cement, right  Acute  Blood Loss Anemia-asymptomatic, monitor   Plan: Physical Therapy as ordered Partial Weight Bearing @ 50% (PWB)  DVT Prophylaxis:  Xarelto, Foot Pumps and TED hose  DISCHARGE PLAN: Home  DISCHARGE NEEDS: HHPT-has other equipment from prior surgery Catheter out, OOB with PT,saline lock IV, hope for D/C tomorrow       Danielle Schmitt  10/31/2017 7:47 AM  Patient ID: Danielle Schmitt, female   DOB: 11/19/1956, 61 y.o.   MRN: [id number]

## 2017-10-31 NOTE — Care Management Note (Addendum)
Case Management Note  Patient Details  Name: Stormy CardJackie L Dagley MRN: [id number] Date of Birth: 05/04/1956  Subjective/Objective:                 Spoke to patient at the bedside, she has all DME including CPM, [facility], CM faxed into them at [phone], they will call back with date they can start. Xaralto covered with $30 copay no prior auth needed.    Action/Plan:  [facility] accepted referral with start date Monday.  Orders and notes need to faxed when available.  11/01/17 Faxed referral to [facility] at [phone]. CM signing off.    Expected Discharge Date:  11/01/17               Expected Discharge Plan:  Home w Home Health Services  In-House Referral:     Discharge planning Services  CM Consult  Post Acute Care Choice:  NA Choice offered to:  Patient  DME Arranged:    DME Agency:     HH Arranged:  PT HH Agency:  ([facility])  Status of Service:  In process, will continue to follow  If discussed at Long Length of Stay Meetings, dates discussed:    Additional Comments:  Lawerance SabalDebbie Isaac Dubie, RN 10/31/2017, 2:40 PM

## 2017-10-31 NOTE — Op Note (Signed)
NAMEJOLENE, GUYETT                  ACCOUNT NO.:  [id number]  MEDICAL RECORD NO.:  [id number]  LOCATION:  SDS                          FACILITY:  [facility]  PHYSICIAN:  Vonna Kotyk. Whitfield, M.D.DATE OF BIRTH:  1956-09-17  DATE OF PROCEDURE:  10/30/2017 DATE OF DISCHARGE:                              OPERATIVE REPORT   PREOPERATIVE DIAGNOSIS:  End-stage osteoarthritis, right knee.  POSTOPERATIVE DIAGNOSIS:  End-stage osteoarthritis, right knee.  PROCEDURE:  Right total knee replacement.  SURGEON:  Vonna Kotyk. Durward Fortes, MD.  ASSISTANT:  Biagio Borg, PAC.  ANESTHESIA:  Spinal with adductor canal block and IV sedation.  COMPLICATIONS:  None.  COMPONENTS:  DePuy LCS standard femoral component, a #2.5 rotating keeled revision tibial tray with a 10-mm polyethylene bridging bearing and metal-backed 3-peg rotating patella.  Components were secured with polymethyl methacrylate.  DESCRIPTION OF PROCEDURE:  Ms. Veazey was met in the holding area, identified the right knee as appropriate operative site and marked it accordingly.  Anesthesia performed an adductor canal block.  The patient was then transported to room #7 and spinal anesthesia was administered by Anesthesia without difficulty with minimal IV sedation.  The patient was then placed supine on the operating table.  Nursing staff inserted a Foley catheter.  Urine was clear.  Tourniquet was then applied to the right thigh.  The right lower extremity was then prepped with chlorhexidine scrub and DuraPrep x2 from the tourniquet to the tips of the toes.  Sterile draping was performed. Time-out was called.  The right lower extremity was then elevated and Esmarch exsanguinated with proximal tourniquet at 350 mmHg.  The patient had a prior anterior and longitudinal incision about the right knee.  This was utilized and extended from the superior pouch to tibial tubercle.  Via sharp dissection, the old incision was carried down to the  subcutaneous tissue in the first layer of capsule.  There was abundant adipose and scar tissue.  The patella was really quite deformed with large osteophytes circumferentially.  The deep capsule was then incised with the Bovie along the length of the skin incision extending medially to avoid the spurs.  At that point, I could flex the knee 90 degrees and evert the patella 180 degrees.  There was complete absence of articular cartilage at the patellofemoral joint in the medial compartment and to a great strength the lateral compartment.  There were large osteophytes along the medial and lateral femoral condyle and medial lateral tibial plateau.  There were also very large osteophytes about the patella. These were removed.  I sized a standard femoral component.  Based on the patient's size, the revision tibial tray was utilized.  The first bony cut was then made transversely with a 3-degrees angle of declination using the external tibial guide.  After each bony cut on the femur and the tibia, I checked the alignment with the external alignment jig.  Subsequent cuts were then made on the femur using the standard femoral jig.  Lamina spreaders were then placed along the medial lateral compartment.  I removed medial and lateral menisci as well as ACL and PCL.  There were large osteophytes along the posterior femoral condyle  medially and to a lesser extent laterally.  Osteophytes were also removed from the medial tibial plateau with a rongeur.  There were some cysts in the bone as well with ganglionic fluid.  These were irrigated. MCL and LCL remained intact.  Flexion and extension gaps were symmetrical at 10 mm.  Final cut was then made on the femur using a 4- degree distal femoral valgus cut and then the final cuts were made for tapering purpose and to obtain the center holes.  Retractors were then placed around the tibia, was advanced anteriorly. It measured a #2.5 tibial tray.  This was  pinned in place.  The center hole was then made.  We exchanged the next guide for the 2.5 revision tibial tray.  This was inserted and fit nicely on the tibia.  We made the keeled cut.  With the tibial trial jig in place, the 10-mm polyethylene bridging trial was then applied followed by the standard trial femoral component.  This was reduced and through a full range of motion, the knee remained perfectly stable.  There was no opening with varus or valgus stress.  We had full extension.  The patella was then prepared by removing 10 mm of bone, leaving 15 mm of patella thickness.  The patella jig was then applied, 3 holes made and the trial patella inserted and reduced.  There were partial subluxations, so I did a partial lateral release about the patella where there was considerable cicatrix from the old osteophytes.  At that point, the patella tracked in the midline.  The trial components removed.  The joint was copiously irrigated with saline solution.  The final components were then impacted with polymethyl methacrylate.  We initially inserted the tibial tray followed by the 10-mm polyethylene bridging bearing and then the standard femoral component.  Extraneous methacrylate was removed from the periphery of the components that was placed in extension and not moved at that point. The patella was applied with methacrylate and a patellar clamp.  At approximately 16 minutes, the methacrylate had matured during which time we injected the joint with 0.25% Marcaine with epinephrine and irrigated the joint.  At 16 minutes, methacrylate had matured, the patella clamp was removed, we checked the joint for any further extraneous methacrylate.  The tourniquet was deflated at 85 minutes.  Tranexamic acid was applied topically and a compression for at least 5 minutes.  We had very minimal bleeding with good capillary refill.  A Hemovac was not necessary.  The deep capsule was then closed with a  running #1 Ethibond, superficial capsule in several layers with 0 and 2-0 Vicryl and then subcu with 3-0 Monocryl.  Skin was closed with skin clips and then a sterile bulky dressing was applied followed by the patient's support stocking.  Technically, the procedure went quite well.  There was considerable deformity, thus requiring longer tourniquet time.  The patient tolerated procedure without complications.     Vonna Kotyk. Durward Fortes, M.D.    PWW/MEDQ  D:  10/30/2017  T:  10/31/2017  Job:  [id number]

## 2017-10-31 NOTE — Progress Notes (Signed)
Physical Therapy Treatment Patient Details Name: Danielle Schmitt L Abaya MRN: [id number] DOB: 05/06/1956 Today's Date: 10/31/2017    History of Present Illness Pt is a 61 y/o female s/p elective R TKA. PMH includes HTN, back surgery, DM, asthma, and L TKA and L THA.     PT Comments    PM session focused on introducing stair training and reinforcing therex. Pt limited by increase in post op pain. Pt educated for proper use of RW on platform stairs for safe return to home set up,  And cued for safe sequencing. Pt able to demonstrate good techinque but will benefit from more practice next PT session. Time spent reviewing therex and encouraging patient to perform more regularly as I suspect she has not been doing many on her own throughout her stay so far.  PT to follow, will focus on increasing activity tolerance in next session.     Follow Up Recommendations  DC plan and follow up therapy as arranged by surgeon;Supervision for mobility/OOB;Home health PT     Equipment Recommendations  None recommended by PT    Recommendations for Other Services       Precautions / Restrictions Precautions Precautions: Knee Restrictions Weight Bearing Restrictions: Yes RLE Weight Bearing: Partial weight bearing RLE Partial Weight Bearing Percentage or Pounds: 50    Mobility  Bed Mobility Overal bed mobility: Needs Assistance Bed Mobility: Supine to Sit     Supine to sit: Supervision        Transfers Overall transfer level: Needs assistance Equipment used: Rolling walker (2 wheeled) Transfers: Sit to/from Stand Sit to Stand: Supervision            Ambulation/Gait Ambulation/Gait assistance: Supervision Ambulation Distance (Feet): 30 Feet Assistive device: Rolling walker (2 wheeled) Gait Pattern/deviations: Step-to pattern;Decreased step length - right;Decreased step length - left;Decreased weight shift to right;Antalgic Gait velocity: Decreased   General Gait Details: Slow, antalgic gait.  Verbal cues for pressing through UEs to maintain 50% WB on RLE. I   Stairs Stairs: Yes   Stair Management: With walker Number of Stairs: 4 General stair comments: 1x4 platform stairs with RW. Cues for WB, RW use, and sequencing. Pt able to perform safely with min guarding. will progress next session.  Wheelchair Mobility    Modified Rankin (Stroke Patients Only)       Balance Overall balance assessment: Needs assistance Sitting-balance support: No upper extremity supported;Feet supported Sitting balance-Leahy Scale: Good     Standing balance support: Bilateral upper extremity supported;During functional activity Standing balance-Leahy Scale: Fair Standing balance comment: Reliant on RW for stability                             Cognition Arousal/Alertness: Awake/alert Behavior During Therapy: WFL for tasks assessed/performed Overall Cognitive Status: Within Functional Limits for tasks assessed                                        Exercises Total Joint Exercises Ankle Circles/Pumps: AROM;Both;20 reps Quad Sets: AROM;Right;10 reps Towel Squeeze: AROM;Both;10 reps Short Arc Quad: AROM;Right;10 reps Heel Slides: AROM;Right;10 reps Straight Leg Raises: AROM;5 reps;AAROM;Right Knee Flexion: AROM;Right;10 reps Goniometric ROM: AAROM Flexion: 80    General Comments General comments (skin integrity, edema, etc.):  Pt reports much higher pain from prior session with nerve block wearing off. Limited session.  Pertinent Vitals/Pain Pain Assessment: Faces Faces Pain Scale: Hurts even more Pain Location: R knee  Pain Descriptors / Indicators: Aching;Operative site guarding Pain Intervention(s): Limited activity within patient's tolerance;Monitored during session;Premedicated before session    Home Living Family/patient expects to be discharged to:: Private residence Living Arrangements: Non-relatives/Friends                  Prior  Function            PT Goals (current goals can now be found in the care plan section) Acute Rehab PT Goals Patient Stated Goal: to go home  PT Goal Formulation: With patient Time For Goal Achievement: 11/06/17 Potential to Achieve Goals: Good Progress towards PT goals: Progressing toward goals    Frequency    7X/week      PT Plan Current plan remains appropriate    Co-evaluation              AM-PAC PT "6 Clicks" Daily Activity  Outcome Measure  Difficulty turning over in bed (including adjusting bedclothes, sheets and blankets)?: None Difficulty moving from lying on back to sitting on the side of the bed? : None Difficulty sitting down on and standing up from a chair with arms (e.g., wheelchair, bedside commode, etc,.)?: A Little Help needed moving to and from a bed to chair (including a wheelchair)?: A Little Help needed walking in hospital room?: A Little Help needed climbing 3-5 steps with a railing? : A Little 6 Click Score: 20    End of Session Equipment Utilized During Treatment: Gait belt Activity Tolerance: Patient limited by pain Patient left: in chair;with call bell/phone within reach Nurse Communication: Mobility status PT Visit Diagnosis: Other abnormalities of gait and mobility (R26.89);Pain Pain - Right/Left: Right Pain - part of body: Knee     Time: [id number] PT Time Calculation (min) (ACUTE ONLY): 21 min  Charges:  $Gait Training: 8-22 mins                    G Codes:       Etta GrandchildSean Laxmi Choung, PT, DPT Acute Rehab Services Pager: [phone]    Etta GrandchildSean  Quinta Eimer 10/31/2017, 4:15 PM

## 2017-10-31 NOTE — Progress Notes (Signed)
Physical Therapy Treatment Patient Details Name: Danielle Schmitt MRN: [id number] DOB: 04/16/1956 Today's Date: 10/31/2017    History of Present Illness Pt is a 61 y/o female s/p elective R TKA. PMH includes HTN, back surgery, DM, asthma, and L TKA and L THA.     PT Comments    Session focused on gait training and improving activity tolerance. Pt demonstrating increased independence with transfers and able to ambulate further distances since prior sessions. On room air pt SpO2= 95% with 100 feet of walking SpO2=84%, rises back to >90% within 20 second rest breaks. Pt adhering to WB percuatations with use of RW and supervision and cueing. Pt using incentive spirometer incorrectly, educated for proper use and dosage. PM session will focus on therex.     Follow Up Recommendations  DC plan and follow up therapy as arranged by surgeon;Supervision for mobility/OOB;Home health PT     Equipment Recommendations  None recommended by PT    Recommendations for Other Services   .     Precautions / Restrictions Precautions Precautions: Knee Restrictions Weight Bearing Restrictions: Yes RLE Weight Bearing: Partial weight bearing RLE Partial Weight Bearing Percentage or Pounds: 50    Mobility  Bed Mobility               General bed mobility comments: Pt in chair upon entry.  Transfers Overall transfer level: Needs assistance Equipment used: Rolling walker (2 wheeled) Transfers: Sit to/from Stand Sit to Stand: Supervision            Ambulation/Gait Ambulation/Gait assistance: Min guard Ambulation Distance (Feet): 300 Feet Assistive device: Rolling walker (2 wheeled) Gait Pattern/deviations: Step-to pattern;Decreased step length - right;Decreased step length - left;Decreased weight shift to right;Antalgic Gait velocity: Decreased   General Gait Details: Slow, antalgic gait. Verbal cues for pressing through UEs to maintain 50% WB on RLE. Intermittent rest breaks due to fatigue.  Verbal cues for sequencing using RW.    Stairs            Wheelchair Mobility    Modified Rankin (Stroke Patients Only)       Balance Overall balance assessment: Needs assistance Sitting-balance support: No upper extremity supported;Feet supported Sitting balance-Leahy Scale: Good     Standing balance support: Bilateral upper extremity supported;During functional activity Standing balance-Leahy Scale: Fair Standing balance comment: Reliant on RW for stability                             Cognition Arousal/Alertness: Awake/alert Behavior During Therapy: WFL for tasks assessed/performed Overall Cognitive Status: Within Functional Limits for tasks assessed                                        Exercises      General Comments        Pertinent Vitals/Pain Pain Assessment: 0-10 Pain Score: 2  Pain Location: R knee  Pain Descriptors / Indicators: Aching;Operative site guarding Pain Intervention(s): Limited activity within patient's tolerance;Monitored during session;Patient requesting pain meds-RN notified;Repositioned    Home Living                      Prior Function            PT Goals (current goals can now be found in the care plan section) Acute Rehab PT Goals Patient Stated Goal: to  go home  PT Goal Formulation: With patient Time For Goal Achievement: 11/06/17 Potential to Achieve Goals: Good Progress towards PT goals: Progressing toward goals    Frequency    7X/week      PT Plan Current plan remains appropriate    Co-evaluation              AM-PAC PT "6 Clicks" Daily Activity  Outcome Measure  Difficulty turning over in bed (including adjusting bedclothes, sheets and blankets)?: None Difficulty moving from lying on back to sitting on the side of the bed? : None Difficulty sitting down on and standing up from a chair with arms (e.g., wheelchair, bedside commode, etc,.)?: A Little Help needed  moving to and from a bed to chair (including a wheelchair)?: A Little Help needed walking in hospital room?: A Little Help needed climbing 3-5 steps with a railing? : A Little 6 Click Score: 20    End of Session Equipment Utilized During Treatment: Gait belt Activity Tolerance: Patient limited by fatigue Patient left: in chair;with call bell/phone within reach Nurse Communication: Mobility status PT Visit Diagnosis: Other abnormalities of gait and mobility (R26.89);Pain Pain - Right/Left: Right Pain - part of body: Knee     Time: 0940-1003 PT Time Calculation (min) (ACUTE ONLY): 23 min  Charges:  $Gait Training: 8-22 mins                    G Codes:       Etta GrandchildSean Barkley Kratochvil, PT, DPT Acute Rehab Services Pager: [phone]     Etta GrandchildSean  [name] 10/31/2017, 10:17 AM

## 2017-10-31 NOTE — Discharge Instructions (Signed)
Information on my medicine - XARELTO® (Rivaroxaban) ° °This medication education was reviewed with me or my healthcare representative as part of my discharge preparation.  The pharmacist that spoke with me during my hospital stay was:  Trish Mancinelli Dien, RPH ° °Why was Xarelto® prescribed for you? °Xarelto® was prescribed for you to reduce the risk of blood clots forming after orthopedic surgery. The medical term for these abnormal blood clots is venous thromboembolism (VTE). ° °What do you need to know about xarelto® ? °Take your Xarelto® ONCE DAILY at the same time every day. °You may take it either with or without food. ° °If you have difficulty swallowing the tablet whole, you may crush it and mix in applesauce just prior to taking your dose. ° °Take Xarelto® exactly as prescribed by your doctor and DO NOT stop taking Xarelto® without talking to the doctor who prescribed the medication.  Stopping without other VTE prevention medication to take the place of Xarelto® may increase your risk of developing a clot. ° °After discharge, you should have regular check-up appointments with your healthcare provider that is prescribing your Xarelto®.   ° °What do you do if you miss a dose? °If you miss a dose, take it as soon as you remember on the same day then continue your regularly scheduled once daily regimen the next day. Do not take two doses of Xarelto® on the same day.  ° °Important Safety Information °A possible side effect of Xarelto® is bleeding. You should call your healthcare provider right away if you experience any of the following: °? Bleeding from an injury or your nose that does not stop. °? Unusual colored urine (red or dark brown) or unusual colored stools (red or black). °? Unusual bruising for unknown reasons. °? A serious fall or if you hit your head (even if there is no bleeding). ° °Some medicines may interact with Xarelto® and might increase your risk of bleeding while on Xarelto®. To help avoid  this, consult your healthcare provider or pharmacist prior to using any new prescription or non-prescription medications, including herbals, vitamins, non-steroidal anti-inflammatory drugs (NSAIDs) and supplements. ° °This website has more information on Xarelto®: www.xarelto.com. ° ° ° °

## 2017-11-01 LAB — BASIC METABOLIC PANEL
Anion gap: 9 (ref 5–15)
BUN: 10 mg/dL (ref 6–20)
CALCIUM: 8.7 mg/dL — AB (ref 8.9–10.3)
CO2: 23 mmol/L (ref 22–32)
CREATININE: 0.71 mg/dL (ref 0.44–1.00)
Chloride: 106 mmol/L (ref 101–111)
GFR calc Af Amer: >60 mL/min (ref >60)
GLUCOSE: 152 mg/dL — AB (ref 65–99)
POTASSIUM: 4 mmol/L (ref 3.5–5.1)
Sodium: 138 mmol/L (ref 135–145)

## 2017-11-01 LAB — CBC
HCT: 28.3 % — ABNORMAL LOW (ref 36.0–46.0)
Hemoglobin: 9.4 g/dL — ABNORMAL LOW (ref 12.0–15.0)
MCH: 29.5 pg (ref 26.0–34.0)
MCHC: 33.2 g/dL (ref 30.0–36.0)
MCV: 88.7 fL (ref 78.0–100.0)
PLATELETS: 326 10*3/uL (ref 150–400)
RBC: 3.19 MIL/uL — ABNORMAL LOW (ref 3.87–5.11)
RDW: 12.9 % (ref 11.5–15.5)
WBC: 10.3 10*3/uL (ref 4.0–10.5)

## 2017-11-01 NOTE — Discharge Summary (Signed)
Pt given prescriptions and discharge instructions, gone over with her and answered any questions to satisfaction. She is awaiting her ride. Pt in no distress at discharge.

## 2017-11-01 NOTE — Progress Notes (Signed)
Physical Therapy Treatment Patient Details Name: Danielle CardJackie L Mendenhall MRN: [id number] DOB: 06/19/1956 Today's Date: 11/01/2017    History of Present Illness Pt is a 61 y/o female s/p elective R TKA. PMH includes HTN, back surgery, DM, asthma, and L TKA and L THA.     PT Comments    AM session focused on reinforcing stair training and increasing activity tolerance. Pt demonstrating better safety on stairs with RW and WB precautions, no LOB or cues needed for sequencing this session. Will focus on therex this PM session. Pt reporting no questions at this time.    Follow Up Recommendations  DC plan and follow up therapy as arranged by surgeon;Supervision for mobility/OOB;Home health PT     Equipment Recommendations  None recommended by PT    Recommendations for Other Services       Precautions / Restrictions Precautions Precautions: Knee Restrictions Weight Bearing Restrictions: Yes RLE Weight Bearing: Partial weight bearing RLE Partial Weight Bearing Percentage or Pounds: 50    Mobility  Bed Mobility Overal bed mobility: Needs Assistance Bed Mobility: Supine to Sit     Supine to sit: Supervision     General bed mobility comments: Pt in chair upon entry.  Transfers Overall transfer level: Needs assistance Equipment used: Rolling walker (2 wheeled) Transfers: Sit to/from Stand Sit to Stand: Supervision         General transfer comment: Supervison for safety.   Ambulation/Gait Ambulation/Gait assistance: Supervision Ambulation Distance (Feet): 150 Feet Assistive device: Rolling walker (2 wheeled) Gait Pattern/deviations: Step-to pattern;Decreased step length - right;Decreased step length - left;Decreased weight shift to right;Antalgic Gait velocity: Decreased   General Gait Details: Slow, antalgic gait. Verbal cues for pressing through UEs to maintain 50% WB on RLE. I   Stairs Stairs: Yes   Stair Management: With walker Number of Stairs: 8 General stair comments:  1x8 platform stairs with RW. Demonstrating WB status and no LOB. proper sequencing.   Wheelchair Mobility    Modified Rankin (Stroke Patients Only)       Balance Overall balance assessment: Needs assistance Sitting-balance support: No upper extremity supported;Feet supported Sitting balance-Leahy Scale: Good     Standing balance support: Bilateral upper extremity supported;During functional activity Standing balance-Leahy Scale: Fair Standing balance comment: Reliant on RW for stability                             Cognition Arousal/Alertness: Awake/alert Behavior During Therapy: WFL for tasks assessed/performed Overall Cognitive Status: Within Functional Limits for tasks assessed                                        Exercises Total Joint Exercises Quad Sets: AROM;Right;10 reps Knee Flexion: AROM;Right;10 reps    General Comments        Pertinent Vitals/Pain Pain Assessment: Faces Faces Pain Scale: Hurts even more Pain Location: R knee  Pain Descriptors / Indicators: Aching;Operative site guarding Pain Intervention(s): Limited activity within patient's tolerance;Monitored during session    Home Living                      Prior Function            PT Goals (current goals can now be found in the care plan section) Acute Rehab PT Goals Patient Stated Goal: to go home  PT Goal Formulation:  With patient Time For Goal Achievement: 11/06/17 Potential to Achieve Goals: Good Progress towards PT goals: Progressing toward goals    Frequency    7X/week      PT Plan Current plan remains appropriate    Co-evaluation              AM-PAC PT "6 Clicks" Daily Activity  Outcome Measure  Difficulty turning over in bed (including adjusting bedclothes, sheets and blankets)?: None Difficulty moving from lying on back to sitting on the side of the bed? : None Difficulty sitting down on and standing up from a chair with arms  (e.g., wheelchair, bedside commode, etc,.)?: A Little Help needed moving to and from a bed to chair (including a wheelchair)?: A Little Help needed walking in hospital room?: A Little Help needed climbing 3-5 steps with a railing? : A Little 6 Click Score: 20    End of Session Equipment Utilized During Treatment: Gait belt Activity Tolerance: Patient limited by pain Patient left: in chair;with call bell/phone within reach Nurse Communication: Mobility status PT Visit Diagnosis: Other abnormalities of gait and mobility (R26.89);Pain Pain - Right/Left: Right Pain - part of body: Knee     Time: 1210-1232 PT Time Calculation (min) (ACUTE ONLY): 22 min  Charges:  $Gait Training: 8-22 mins                    G Codes:       Etta GrandchildSean Vernell Townley, PT, DPT Acute Rehab Services Pager: [phone]     Etta GrandchildSean  Rande Dario 11/01/2017, 5:18 PM

## 2017-11-01 NOTE — Plan of Care (Signed)
  Completed/Met Education: Knowledge of General Education information will improve 11/01/2017 1603 - Completed/Met by Governor Rooks, RN Health Behavior/Discharge Planning: Ability to manage health-related needs will improve 11/01/2017 1603 - Completed/Met by Governor Rooks, RN Clinical Measurements: Ability to maintain clinical measurements within normal limits will improve 11/01/2017 1603 - Completed/Met by Governor Rooks, RN Will remain free from infection 11/01/2017 1603 - Completed/Met by Governor Rooks, RN Diagnostic test results will improve 11/01/2017 1603 - Completed/Met by Governor Rooks, RN Respiratory complications will improve 11/01/2017 1603 - Completed/Met by Governor Rooks, RN Cardiovascular complication will be avoided 11/01/2017 1603 - Completed/Met by Governor Rooks, RN Activity: Risk for activity intolerance will decrease 11/01/2017 1603 - Completed/Met by Governor Rooks, RN Nutrition: Adequate nutrition will be maintained 11/01/2017 1603 - Completed/Met by Governor Rooks, RN Coping: Level of anxiety will decrease 11/01/2017 1603 - Completed/Met by Governor Rooks, RN Elimination: Will not experience complications related to bowel motility 11/01/2017 1603 - Completed/Met by Governor Rooks, RN Will not experience complications related to urinary retention 11/01/2017 1603 - Completed/Met by Governor Rooks, RN Pain Managment: General experience of comfort will improve 11/01/2017 1603 - Completed/Met by Governor Rooks, RN Safety: Ability to remain free from injury will improve 11/01/2017 1603 - Completed/Met by Governor Rooks, RN Skin Integrity: Risk for impaired skin integrity will decrease 11/01/2017 1603 - Completed/Met by Governor Rooks, RN Education: Knowledge of the prescribed therapeutic regimen will improve 11/01/2017 1603 - Completed/Met by Governor Rooks, RN Activity: Ability to avoid complications of mobility impairment will improve 11/01/2017  1603 - Completed/Met by Governor Rooks, RN Range of joint motion will improve 11/01/2017 1603 - Completed/Met by Governor Rooks, RN Clinical Measurements: Postoperative complications will be avoided or minimized 11/01/2017 1603 - Completed/Met by Governor Rooks, RN Pain Management: Pain level will decrease with appropriate interventions 11/01/2017 1603 - Completed/Met by Governor Rooks, RN Skin Integrity: Signs of wound healing will improve 11/01/2017 1603 - Completed/Met by Governor Rooks, RN

## 2017-11-01 NOTE — Progress Notes (Signed)
PATIENT ID: Danielle Schmitt        MRN:  [id number]          DOB/AGE: 59/02/1956 / 59 y.o.    Norlene CampbellPeter Rockwell Zentz, MD   Jacqualine CodeBrian Petrarca, PA-C [address] [place], [place]  [id number]                             [phone]   PROGRESS NOTE  Subjective:  negative for Chest Pain  negative for Shortness of Breath  positive for Nausea/Vomiting   negative for Calf Pain    Tolerating Diet: yes         Patient reports pain as moderate.     "I feel wiped out"-mild nausea related to pain meds-OK with zofran  Objective: Vital signs in last 24 hours:    Patient Vitals for the past 24 hrs:  BP Temp Temp src Pulse Resp SpO2  11/01/17 0928 120/66 - - 99 16 95 %  11/01/17 0530 (!) 125/54 99.5 F (37.5 C) Oral (!) 102 16 94 %  10/31/17 2016 118/64 100 F (37.8 C) Oral (!) 112 16 92 %  10/31/17 1533 (!) 115/53 99.4 F (37.4 C) Oral 96 16 94 %      Intake/Output from previous day:   11/07 0701 - 11/08 0700 In: 720 [P.O.:720] Out: -    Intake/Output this shift:   11/08 0701 - 11/08 1900 In: 480 [P.O.:480] Out: -    Intake/Output      11/07 0701 - 11/08 0700 11/08 0701 - 11/09 0700   P.O. 720 480   I.V. (mL/kg)     Other     Total Intake(mL/kg) 720 (7.2) 480 (4.8)   Urine (mL/kg/hr)     Blood     Total Output     Net +720 +480        Urine Occurrence 8 x       LABORATORY DATA: Recent Labs    10/31/17 0426 11/01/17 0426  WBC 8.2 10.3  HGB 9.5* 9.4*  HCT 28.6* 28.3*  PLT 280 326   Recent Labs    10/31/17 0426 11/01/17 0426  NA 133* 138  K 4.1 4.0  CL 102 106  CO2 24 23  BUN 15 10  CREATININE 0.71 0.71  GLUCOSE 110* 152*  CALCIUM 8.2* 8.7*   Lab Results  Component Value Date   INR 0.96 10/19/2017   INR 0.92 06/11/2013   INR 0.95 01/10/2010    Recent Radiographic Studies :  Dg Chest 2 View  Result Date: 10/19/2017 CLINICAL DATA:  Initial evaluation for preoperative evaluation. History of pneumonia, asthma. EXAM: CHEST  2 VIEW COMPARISON:  Prior radiograph  from 06/18/2013. FINDINGS: Cardiac and mediastinal silhouettes are stable in size and contour, and remain within normal limits. Elevation of the right hemidiaphragm, similar to previous, likely chronic. Patchy right basilar opacity favored to reflect atelectasis, although infiltrate could be considered in the correct clinical setting. Underlying scattered diffuse peribronchial thickening suggestive of changes related asthma and/ or bronchiolitis. No pulmonary edema or pleural effusion. No pneumothorax. No acute osseus abnormality. IMPRESSION: 1. Chronic elevation of the right hemidiaphragm. Associated right basilar opacity likely reflects atelectasis/bronchovascular crowding. Infectious infiltrate could be considered in the correct clinical setting. 2. Underlying scattered peribronchial thickening, suspected be related history of asthma. Active bronchiolitis could also be considered. Electronically Signed   By: Rise MuBenjamin  McClintock M.D.   On: 10/19/2017 15:47  Examination:  General appearance: alert, cooperative and no distress  Wound Exam: clean, dry, intact   Drainage:  None: wound tissue dry  Motor Exam: EHL, FHL, Anterior Tibial and Posterior Tibial Intact  Sensory Exam: intact  Vascular Exam: Normal  Assessment:    2 Days Post-Op  Procedure(s) (LRB): RIGHT TOTAL KNEE ARTHROPLASTY (Right)  ADDITIONAL DIAGNOSIS:  Principal Problem:   Primary osteoarthritis of right knee Active Problems:   S/P TKR (total knee replacement) using cement, right  Acute Blood Loss Anemia-stable and asymptomatic   Plan: Physical Therapy as ordered Partial Weight Bearing @ 50% (PWB)  DVT Prophylaxis:  Xarelto, Foot Pumps and TED hose  DISCHARGE PLAN: Home  DISCHARGE NEEDS: HHPT Voiding without difficulty, good effort in PT, dressing changed-wound clean and dry.. Will discharge today       Valeria Batmaneter W Simone Tuckey  11/01/2017 9:49 AM  Patient ID: Danielle Schmitt, female   DOB: 12/01/1956, 61 y.o.    MRN: [id number]

## 2017-11-01 NOTE — Progress Notes (Signed)
PT Cancellation Note  Patient Details Name: Stormy CardJackie L Ruzich MRN: [id number] DOB: 03/13/1956   Cancelled Treatment:    Reason Eval/Treat Not Completed: Other (comment). Pt ready for transport and declining an additional PT session for stair training prior to d/c. Educ on stair technique with RW and pt states understanding. Pt has no further questions or concerns.  Ina HomesJaclyn Caiden Arteaga, PT, DPT Acute Rehab Services  Pager: [phone]  Malachy ChamberJaclyn L Treyveon Mochizuki 11/01/2017, 4:57 PM

## 2017-11-06 ENCOUNTER — Telehealth (INDEPENDENT_AMBULATORY_CARE_PROVIDER_SITE_OTHER): Payer: Self-pay | Admitting: Orthopaedic Surgery

## 2017-11-06 NOTE — Telephone Encounter (Signed)
Marge -Case Manager with [employer] calling in ref to the attending provider's statement that has patient's starting leave date as Nov 6th, however patient was off starting with 10/24/17. Marge would like to know if the doctor would support this starting date.   cb  [phone]

## 2017-11-06 NOTE — Telephone Encounter (Signed)
yes

## 2017-11-06 NOTE — Telephone Encounter (Signed)
Please advise 

## 2017-11-07 NOTE — Telephone Encounter (Signed)
Se PW response

## 2017-11-08 NOTE — Telephone Encounter (Signed)
I called [employer] and spoke w/Marge to let her know that Dr Cleophas DunkerWhitfield would support the starting leave date at 10/24/17.

## 2017-11-12 NOTE — Addendum Note (Signed)
Addendum  created 11/12/17 1618 by Cristela BlueJackson, Makisha Marrin, MD   Delete clinical note, Intraprocedure Blocks edited, Order Canceled from Note

## 2017-11-14 ENCOUNTER — Inpatient Hospital Stay (INDEPENDENT_AMBULATORY_CARE_PROVIDER_SITE_OTHER): Payer: BLUE CROSS/BLUE SHIELD | Admitting: Orthopaedic Surgery

## 2017-11-21 ENCOUNTER — Encounter (INDEPENDENT_AMBULATORY_CARE_PROVIDER_SITE_OTHER): Payer: Self-pay | Admitting: Orthopaedic Surgery

## 2017-11-21 ENCOUNTER — Ambulatory Visit (INDEPENDENT_AMBULATORY_CARE_PROVIDER_SITE_OTHER): Payer: BLUE CROSS/BLUE SHIELD | Admitting: Orthopaedic Surgery

## 2017-11-21 ENCOUNTER — Ambulatory Visit (INDEPENDENT_AMBULATORY_CARE_PROVIDER_SITE_OTHER): Payer: Self-pay

## 2017-11-21 VITALS — BP 100/62 | HR 89 | Resp 14 | Ht 63.0 in | Wt 220.0 lb

## 2017-11-21 DIAGNOSIS — Z96651 Presence of right artificial knee joint: Secondary | ICD-10-CM

## 2017-11-21 NOTE — Progress Notes (Signed)
Office Visit Note   Patient: Danielle Schmitt           Date of Birth: 07/04/1956           MRN: [id number] Visit Date: 11/21/2017              Requested by: Jonathon BellowsMcGee, Rachel, DO [address] [place], [place] [id number] PCP: Jonathon BellowsMcGee, Rachel, DO   Assessment & Plan: Visit Diagnoses:  1. Presence of right artificial knee joint     Plan: 3 weeks status post right total knee replacement doing well. No shortness of breath or chest pain. Wound healing without problem. Remove some clips and applied Steri-Strips. Weightbearing as tolerated with cane. Office 2 weeks. Being followed by her prior to surgery pain clinic for any analgesics. No work. No driving until she is evaluated 2 weeks  Follow-Up Instructions: Return in about 2 weeks (around 12/05/2017).   Orders:  Orders Placed This Encounter  Procedures  . XR KNEE 3 VIEW RIGHT   No orders of the defined types were placed in this encounter.     Procedures: No procedures performed   Clinical Data: No additional findings.   Subjective: Chief Complaint  Patient presents with  . Right Knee - Routine Post Op, Numbness, Pain    Ms.Valliant is a 61 y o S/P 3 weeks  Right TKA. Staples removed, steri strips applied. Ambulates with a cane.   Doing very well with minimal discomfort. Denies shortness of breath or chest pain.. 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 Presently on Norco.xarelto D/C'd last week  HPI  Review of Systems  Constitutional: Negative for chills, fatigue and fever.  Eyes: Negative for itching.  Respiratory: Negative for chest tightness and shortness of breath.   Cardiovascular: Negative for chest pain, palpitations and leg swelling.  Gastrointestinal: Negative for blood in stool, constipation and diarrhea.  Endocrine: Negative for polyuria.  Genitourinary: Negative for dysuria.  Musculoskeletal: Positive for back pain.  Negative for joint swelling, neck pain and neck stiffness.  Allergic/Immunologic: Negative for immunocompromised state.  Neurological: Positive for headaches. Negative for dizziness and numbness.  Hematological: Does not bruise/bleed easily.  Psychiatric/Behavioral: The patient is not nervous/anxious.      Objective: Vital Signs: BP 100/62   Pulse 89   Resp 14   Ht 5\' 3"  (1.6 m)   Wt 220 lb (99.8 kg)   BMI 38.97 kg/m   Physical Exam  Ortho Exam knee wound is healing without evidence of infection. Surgical clips removed and Steri-Strips applied. No calf pain. Neurovascular exam intact. Full extension without instability. Flexed 90.  Specialty Comments:  No specialty comments available.  Imaging: No results found.   PMFS History: Patient Active Problem List   Diagnosis Date Noted  . Primary osteoarthritis of right knee 10/30/2017  . S/P TKR (total knee replacement) using cement, right 10/30/2017  . Postoperative anemia due to acute blood loss 06/19/2013  . Osteoarthritis of left hip 06/17/2013  . Morbid obesity (HCC) 06/17/2013  . Asthma 06/17/2013   Past Medical History:  Diagnosis Date  . Arthritis    "shoulders, knees, fingers, hips" (10/31/2017)  . Asthma   . B12 deficiency anemia   . Chronic lower back pain   . Complication of anesthesia    Hx: of bronchospasm attack during procedure- hysterectomy  . Degenerative joint disease   . Depression   . Environmental allergies   .  History of blood transfusion    left knee replacement and hip replacement  . Hypertension   . Incontinence of urine    "due to vaginal hysterectomy; I have pessary in place" (10/31/2017)  . Iron deficiency anemia   . Migraine    "any time weather/pressure changes"   . Pneumonia    "several times; last time was ~ 2014" (10/31/2017)  . Pseudogout of knee, right   . Spinal headache    ;Hx: of headache with Epidural; migraines    Family History  Problem Relation Age of Onset  . Cancer  - Lung Mother   . Hypertension Mother   . Hypertension Father   . Kidney failure Father   . Hypertension Brother     Past Surgical History:  Procedure Laterality Date  . BACK SURGERY    . CARPAL TUNNEL RELEASE Bilateral   . COLONOSCOPY W/ BIOPSIES AND POLYPECTOMY     Hx: of  . JOINT REPLACEMENT    . KNEE ARTHROPLASTY Left   . KNEE ARTHROSCOPY Bilateral "numerous "  . KNEE RECONSTRUCTION Right    Insoll procedure  . SHOULDER ARTHROSCOPY W/ ROTATOR CUFF REPAIR Right   . SYNOVIAL CYST EXCISION     "inside spinal column"  . TONSILLECTOMY AND ADENOIDECTOMY  1998  . TOTAL HIP ARTHROPLASTY Left 06/17/2013   Procedure: LEFT TOTAL HIP ARTHROPLASTY;  Surgeon: Valeria BatmanPeter W Dayjah Selman, MD;  [facility];  Service: Orthopedics;  Laterality: Left;  . TOTAL KNEE ARTHROPLASTY Right 10/30/2017   Procedure: RIGHT TOTAL KNEE ARTHROPLASTY;  Surgeon: Valeria BatmanWhitfield, Orazio Weller W, MD;  [facility];  Service: Orthopedics;  Laterality: Right;  . TOTAL KNEE ARTHROPLASTY Right 10/30/2017  . VAGINAL HYSTERECTOMY     total   Social History   Occupational History  . Not on file  Tobacco Use  . Smoking status: Never Smoker  . Smokeless tobacco: Never Used  Substance and Sexual Activity  . Alcohol use: Yes    Comment: 10/31/2017 "might have 3 drinks/year"  . Drug use: No  . Sexual activity: No

## 2017-12-05 ENCOUNTER — Inpatient Hospital Stay (INDEPENDENT_AMBULATORY_CARE_PROVIDER_SITE_OTHER): Payer: BLUE CROSS/BLUE SHIELD | Admitting: Orthopaedic Surgery

## 2017-12-12 ENCOUNTER — Encounter (INDEPENDENT_AMBULATORY_CARE_PROVIDER_SITE_OTHER): Payer: Self-pay | Admitting: Orthopaedic Surgery

## 2017-12-12 ENCOUNTER — Ambulatory Visit (INDEPENDENT_AMBULATORY_CARE_PROVIDER_SITE_OTHER): Payer: BLUE CROSS/BLUE SHIELD

## 2017-12-12 ENCOUNTER — Ambulatory Visit (INDEPENDENT_AMBULATORY_CARE_PROVIDER_SITE_OTHER): Payer: BLUE CROSS/BLUE SHIELD | Admitting: Orthopaedic Surgery

## 2017-12-12 VITALS — BP 130/78 | HR 95 | Resp 12 | Ht 63.0 in | Wt 225.0 lb

## 2017-12-12 DIAGNOSIS — Z96651 Presence of right artificial knee joint: Secondary | ICD-10-CM

## 2017-12-12 NOTE — Progress Notes (Deleted)
Office Visit Note   Patient: Danielle Schmitt           Date of Birth: 02/16/1956           MRN: [id number] Visit Date: 12/12/2017              Requested by: Jonathon BellowsMcGee, Rachel, DO [address] [place], [place] [id number] PCP: Jonathon BellowsMcGee, Rachel, DO   Assessment & Plan: Visit Diagnoses: No diagnosis found.  Plan: ***  Follow-Up Instructions: No Follow-up on file.   Orders:  No orders of the defined types were placed in this encounter.  No orders of the defined types were placed in this encounter.     Procedures: No procedures performed   Clinical Data: No additional findings.   Subjective: Chief Complaint  Patient presents with  . Right Knee - Routine Post Op    Danielle Schmitt is a 61 y o S/P 6 weeks Right TKA. She has some weakness and ambulates with a cane. Painful around the patella and sometimes gives way.    HPI  Review of Systems  Constitutional: Positive for fatigue. Negative for chills and fever.  Eyes: Negative for itching.  Respiratory: Negative for chest tightness and shortness of breath.   Cardiovascular: Negative for chest pain, palpitations and leg swelling.  Gastrointestinal: Negative for blood in stool, constipation and diarrhea.  Endocrine: Negative for polyuria.  Genitourinary: Negative for dysuria.  Musculoskeletal: Negative for back pain, joint swelling, neck pain and neck stiffness.  Allergic/Immunologic: Negative for immunocompromised state.  Neurological: Positive for weakness. Negative for dizziness and numbness.  Hematological: Does not bruise/bleed easily.  Psychiatric/Behavioral: The patient is not nervous/anxious.      Objective: Vital Signs: BP 130/78   Pulse 95   Resp 12   Ht 5\' 3"  (1.6 m)   Wt 225 lb (102.1 kg)   BMI 39.86 kg/m   Physical Exam  Ortho Exam  Specialty Comments:  No specialty comments available.  Imaging: No results found.   PMFS History: Patient Active Problem List   Diagnosis Date Noted  . Primary osteoarthritis of  right knee 10/30/2017  . S/P TKR (total knee replacement) using cement, right 10/30/2017  . Postoperative anemia due to acute blood loss 06/19/2013  . Osteoarthritis of left hip 06/17/2013  . Morbid obesity (HCC) 06/17/2013  . Asthma 06/17/2013   Past Medical History:  Diagnosis Date  . Arthritis    "shoulders, knees, fingers, hips" (10/31/2017)  . Asthma   . B12 deficiency anemia   . Chronic lower back pain   . Complication of anesthesia    Hx: of bronchospasm attack during procedure- hysterectomy  . Degenerative joint disease   . Depression   . Environmental allergies   . History of blood transfusion    left knee replacement and hip replacement  . Hypertension   . Incontinence of urine    "due to vaginal hysterectomy; I have pessary in place" (10/31/2017)  . Iron deficiency anemia   . Migraine    "any time weather/pressure changes"   . Pneumonia    "several times; last time was ~ 2014" (10/31/2017)  . Pseudogout of knee, right   . Spinal headache    ;Hx: of headache with Epidural; migraines    Family History  Problem Relation Age of Onset  . Cancer - Lung Mother   . Hypertension Mother   . Hypertension Father   . Kidney failure Father   . Hypertension Brother     Past Surgical History:  Procedure Laterality Date  . BACK SURGERY    . CARPAL TUNNEL RELEASE Bilateral   . COLONOSCOPY W/ BIOPSIES AND POLYPECTOMY     Hx: of  . JOINT REPLACEMENT    . KNEE ARTHROPLASTY Left   . KNEE ARTHROSCOPY Bilateral "numerous "  . KNEE RECONSTRUCTION Right    Insoll procedure  . SHOULDER ARTHROSCOPY W/ ROTATOR CUFF REPAIR Right   . SYNOVIAL CYST EXCISION     "inside spinal column"  . TONSILLECTOMY AND ADENOIDECTOMY  1998  . TOTAL HIP ARTHROPLASTY Left 06/17/2013   Procedure: LEFT TOTAL HIP ARTHROPLASTY;  Surgeon: Valeria BatmanPeter W Whitfield, MD;  [facility];  Service: Orthopedics;  Laterality: Left;  . TOTAL KNEE ARTHROPLASTY Right 10/30/2017   Procedure: RIGHT TOTAL KNEE ARTHROPLASTY;   Surgeon: Valeria BatmanWhitfield, Peter W, MD;  [facility];  Service: Orthopedics;  Laterality: Right;  . TOTAL KNEE ARTHROPLASTY Right 10/30/2017  . VAGINAL HYSTERECTOMY     total   Social History   Occupational History  . Not on file  Tobacco Use  . Smoking status: Never Smoker  . Smokeless tobacco: Never Used  Substance and Sexual Activity  . Alcohol use: Yes    Comment: 10/31/2017 "might have 3 drinks/year"  . Drug use: No  . Sexual activity: No

## 2017-12-12 NOTE — Progress Notes (Signed)
Office Visit Note   Patient: Danielle Schmitt           Date of Birth: 07/11/1956           MRN: [id number] Visit Date: 12/12/2017              Requested by: Jonathon BellowsMcGee, Rachel, DO [address] [place], [place] [phone] PCP: Jonathon BellowsMcGee, Rachel, DO   Assessment & Plan: Visit Diagnoses:  1. Presence of right artificial knee joint     Plan: I think the issue she is having with her right knee replacement are related to the weakness of her quads and hamstrings. I think she'll do fine over time. Continue using the cane until she feels strong enough to discontinue its use. Plan is see her back in several months and urged her to continue with a home exercise program. She'll still need to be out of work until she strong enough to the on her feet without ambulatory aid and I would suspect this may take up to 3 months total  Follow-Up Instructions: Return in about 3 months (around 03/12/2018).   Orders:  Orders Placed This Encounter  Procedures  . XR KNEE 3 VIEW RIGHT   No orders of the defined types were placed in this encounter.     Procedures: No procedures performed   Clinical Data: No additional findings.   Subjective: Chief Complaint  Patient presents with  . Right Knee - Routine Post Op    Danielle Schmitt is a 61 y o S/P 6 weeks Right TKA. She has some weakness and ambulates with a cane. Painful around the patella and sometimes gives way.  Danielle Schmitt 6 weeks status post primary right total knee replacement. She's just concerned as she's had some sensation of "weakness" she denies any fever chills shortness of breath or chest pain. Really not having much swelling. Still using a cane. She continues to go to physical therapy. She basically wanted to be sure that was "nothing wrong  HPI  Review of Systems   Objective: Vital Signs: BP 130/78   Pulse 95   Resp 12   Ht 5\' 3"  (1.6 m)   Wt 225 lb (102.1 kg)   BMI 39.86 kg/m   Physical Exam  Ortho Exam awake alert and oriented 3. Comfortable  sitting. Right knee exam was relatively benign. Her knee was slightly warm which I would expect after just 6 weeks of surgery she did not have any opening with a varus or valgus stress and just had very minimal anterior drawer sign. Patella appears to track in the midline. Incision is healing without any pain. I didn't feel any effusion. No swelling distally. Neurovascular exam intact. There were no significant areas of localized tenderness  Specialty Comments:  No specialty comments available.  Imaging: No results found.   PMFS History: Patient Active Problem List   Diagnosis Date Noted  . Primary osteoarthritis of right knee 10/30/2017  . S/P TKR (total knee replacement) using cement, right 10/30/2017  . Postoperative anemia due to acute blood loss 06/19/2013  . Osteoarthritis of left hip 06/17/2013  . Morbid obesity (HCC) 06/17/2013  . Asthma 06/17/2013   Past Medical History:  Diagnosis Date  . Arthritis    "shoulders, knees, fingers, hips" (10/31/2017)  . Asthma   . B12 deficiency anemia   . Chronic lower back pain   . Complication of anesthesia    Hx: of bronchospasm attack during procedure- hysterectomy  . Degenerative joint disease   .  Depression   . Environmental allergies   . History of blood transfusion    left knee replacement and hip replacement  . Hypertension   . Incontinence of urine    "due to vaginal hysterectomy; I have pessary in place" (10/31/2017)  . Iron deficiency anemia   . Migraine    "any time weather/pressure changes"   . Pneumonia    "several times; last time was ~ 2014" (10/31/2017)  . Pseudogout of knee, right   . Spinal headache    ;Hx: of headache with Epidural; migraines    Family History  Problem Relation Age of Onset  . Cancer - Lung Mother   . Hypertension Mother   . Hypertension Father   . Kidney failure Father   . Hypertension Brother     Past Surgical History:  Procedure Laterality Date  . BACK SURGERY    . CARPAL TUNNEL  RELEASE Bilateral   . COLONOSCOPY W/ BIOPSIES AND POLYPECTOMY     Hx: of  . JOINT REPLACEMENT    . KNEE ARTHROPLASTY Left   . KNEE ARTHROSCOPY Bilateral "numerous "  . KNEE RECONSTRUCTION Right    Insoll procedure  . SHOULDER ARTHROSCOPY W/ ROTATOR CUFF REPAIR Right   . SYNOVIAL CYST EXCISION     "inside spinal column"  . TONSILLECTOMY AND ADENOIDECTOMY  1998  . TOTAL HIP ARTHROPLASTY Left 06/17/2013   Procedure: LEFT TOTAL HIP ARTHROPLASTY;  Surgeon: Valeria BatmanPeter W Blakelyn Dinges, MD;  [facility];  Service: Orthopedics;  Laterality: Left;  . TOTAL KNEE ARTHROPLASTY Right 10/30/2017   Procedure: RIGHT TOTAL KNEE ARTHROPLASTY;  Surgeon: Valeria BatmanWhitfield, Brodyn Depuy W, MD;  [facility];  Service: Orthopedics;  Laterality: Right;  . TOTAL KNEE ARTHROPLASTY Right 10/30/2017  . VAGINAL HYSTERECTOMY     total   Social History   Occupational History  . Not on file  Tobacco Use  . Smoking status: Never Smoker  . Smokeless tobacco: Never Used  Substance and Sexual Activity  . Alcohol use: Yes    Comment: 10/31/2017 "might have 3 drinks/year"  . Drug use: No  . Sexual activity: No     Valeria BatmanPeter W Raechelle Sarti, MD   Note - This record has been created using [employer] software.  Chart creation errors have been sought, but may not always  have been located. Such creation errors do not reflect on  the standard of medical care.

## 2018-01-23 ENCOUNTER — Encounter (INDEPENDENT_AMBULATORY_CARE_PROVIDER_SITE_OTHER): Payer: Self-pay | Admitting: Orthopaedic Surgery

## 2018-01-23 ENCOUNTER — Ambulatory Visit (INDEPENDENT_AMBULATORY_CARE_PROVIDER_SITE_OTHER): Payer: BLUE CROSS/BLUE SHIELD | Admitting: Orthopaedic Surgery

## 2018-01-23 VITALS — BP 142/76 | HR 70 | Resp 16 | Ht 63.0 in | Wt 220.0 lb

## 2018-01-23 DIAGNOSIS — Z96651 Presence of right artificial knee joint: Secondary | ICD-10-CM

## 2018-01-23 NOTE — Progress Notes (Signed)
Office Visit Note   Patient: Danielle Schmitt           Date of Birth: 10/28/1956           MRN: [id number] Visit Date: 01/23/2018              Requested by: Danielle Schmitt, Rachel, DO [address] [place], [place] [phone] PCP: Danielle Schmitt, Rachel, DO   Assessment & Plan: Visit Diagnoses:  1. History of total right knee replacement     Plan: still having some discomfort in her right knee that precludes her ability to return to work full-time capacity.Her job requires her to get up and down, drive and walk. She doesn't feel like her knee is "stable enough yet". Physical therapists agree. I will see her back in a month and continue with her disability I think her biggest problem is quad and hamstring weakness. Continue with physical therapy and home exercise program  Follow-Up Instructions: Return in about 1 month (around 02/21/2018).   Orders:  No orders of the defined types were placed in this encounter.  No orders of the defined types were placed in this encounter.     Procedures: No procedures performed   Clinical Data: No additional findings.   Subjective: Chief Complaint  Patient presents with  . Right Knee - Routine Post Op    Danielle Schmitt is a 62 y o S/P approx. 12 weeks Right total knee arthroplasty. Pt relates pain lateral, knee buckles,hyperextends throwing her gait off causing back spasms.Pt. In PT, both pt and therapist agree she is not ready to go back to work.  no fever or chills shortness of breath or chest pain. Her job requires her to be physically active. [name] does't feel like her knee is ready to return to regular duty. Physical therapy note supports the above.  HPI  Review of Systems  Constitutional: Positive for fatigue. Negative for chills and fever.  HENT: Positive for sinus pressure.   Eyes: Negative for itching.  Respiratory: Positive for shortness of breath. Negative for chest tightness.   Cardiovascular: Negative for chest pain, palpitations and leg swelling.    Gastrointestinal: Negative for blood in stool, constipation and diarrhea.  Endocrine: Negative for polyuria.  Genitourinary: Negative for dysuria.  Musculoskeletal: Positive for back pain and joint swelling. Negative for neck pain and neck stiffness.  Allergic/Immunologic: Negative for immunocompromised state.  Neurological: Positive for weakness and headaches. Negative for dizziness and numbness.  Hematological: Does not bruise/bleed easily.  Psychiatric/Behavioral: Positive for sleep disturbance. The patient is not nervous/anxious.      Objective: Vital Signs: BP (!) 142/76   Pulse 70   Resp 16   Ht 5\' 3"  (1.6 m)   Wt 220 lb (99.8 kg)   BMI 38.97 kg/m   Physical Exam  Ortho Examawake alert and oriented 3 comfortable sitting. Right knee incision is healing without problem. No obvious knee effusion. I could not discern any instability with a varus or valgus stress or with an anterior drawer sign. No popliteal pain or calf pain. Neurovascular exam intact. Has full extension and flexion about 110. No localized medial lateral joint pain. No unusual noises.Straight leg raise negative. Painless range of motion right hip  Specialty Comments:  No specialty comments available.  Imaging: No results found.   PMFS History: Patient Active Problem List   Diagnosis Date Noted  . Primary osteoarthritis of right knee 10/30/2017  . S/P TKR (total knee replacement) using cement, right 10/30/2017  . Postoperative anemia  due to acute blood loss 06/19/2013  . Osteoarthritis of left hip 06/17/2013  . Morbid obesity (HCC) 06/17/2013  . Asthma 06/17/2013   Past Medical History:  Diagnosis Date  . Arthritis    "shoulders, knees, fingers, hips" (10/31/2017)  . Asthma   . B12 deficiency anemia   . Chronic lower back pain   . Complication of anesthesia    Hx: of bronchospasm attack during procedure- hysterectomy  . Degenerative joint disease   . Depression   . Environmental allergies   .  History of blood transfusion    left knee replacement and hip replacement  . Hypertension   . Incontinence of urine    "due to vaginal hysterectomy; I have pessary in place" (10/31/2017)  . Iron deficiency anemia   . Migraine    "any time weather/pressure changes"   . Pneumonia    "several times; last time was ~ 2014" (10/31/2017)  . Pseudogout of knee, right   . Spinal headache    ;Hx: of headache with Epidural; migraines    Family History  Problem Relation Age of Onset  . Cancer - Lung Mother   . Hypertension Mother   . Hypertension Father   . Kidney failure Father   . Hypertension Brother     Past Surgical History:  Procedure Laterality Date  . BACK SURGERY    . CARPAL TUNNEL RELEASE Bilateral   . COLONOSCOPY W/ BIOPSIES AND POLYPECTOMY     Hx: of  . JOINT REPLACEMENT    . KNEE ARTHROPLASTY Left   . KNEE ARTHROSCOPY Bilateral "numerous "  . KNEE RECONSTRUCTION Right    Insoll procedure  . SHOULDER ARTHROSCOPY W/ ROTATOR CUFF REPAIR Right   . SYNOVIAL CYST EXCISION     "inside spinal column"  . TONSILLECTOMY AND ADENOIDECTOMY  1998  . TOTAL HIP ARTHROPLASTY Left 06/17/2013   Procedure: LEFT TOTAL HIP ARTHROPLASTY;  Surgeon: Valeria Batman, MD;  Location: [facility];  Service: Orthopedics;  Laterality: Left;  . TOTAL KNEE ARTHROPLASTY Right 10/30/2017   Procedure: RIGHT TOTAL KNEE ARTHROPLASTY;  Surgeon: Valeria Batman, MD;  Location: [facility] OR;  Service: Orthopedics;  Laterality: Right;  . TOTAL KNEE ARTHROPLASTY Right 10/30/2017  . VAGINAL HYSTERECTOMY     total   Social History   Occupational History  . Not on file  Tobacco Use  . Smoking status: Never Smoker  . Smokeless tobacco: Never Used  Substance and Sexual Activity  . Alcohol use: Yes    Comment: 10/31/2017 "might have 3 drinks/year"  . Drug use: No  . Sexual activity: No

## 2018-02-06 ENCOUNTER — Ambulatory Visit (INDEPENDENT_AMBULATORY_CARE_PROVIDER_SITE_OTHER): Payer: BLUE CROSS/BLUE SHIELD | Admitting: Orthopaedic Surgery

## 2018-02-20 ENCOUNTER — Ambulatory Visit (INDEPENDENT_AMBULATORY_CARE_PROVIDER_SITE_OTHER): Payer: BLUE CROSS/BLUE SHIELD | Admitting: Orthopaedic Surgery

## 2018-02-20 ENCOUNTER — Encounter (INDEPENDENT_AMBULATORY_CARE_PROVIDER_SITE_OTHER): Payer: Self-pay | Admitting: Orthopaedic Surgery

## 2018-02-20 DIAGNOSIS — Z96651 Presence of right artificial knee joint: Secondary | ICD-10-CM

## 2018-02-20 MED ORDER — METHYLPREDNISOLONE ACETATE 40 MG/ML IJ SUSP
80.0000 mg | INTRAMUSCULAR | Status: AC | PRN
  Administered 2018-02-20: 80 mg

## 2018-02-20 MED ORDER — BUPIVACAINE HCL 0.5 % IJ SOLN
2.0000 mL | INTRAMUSCULAR | Status: AC | PRN
  Administered 2018-02-20: 2 mL via INTRA_ARTICULAR

## 2018-02-20 MED ORDER — LIDOCAINE HCL 1 % IJ SOLN
2.0000 mL | INTRAMUSCULAR | Status: AC | PRN
  Administered 2018-02-20: 2 mL

## 2018-02-20 NOTE — Progress Notes (Signed)
Office Visit Note   Patient: Danielle Schmitt           Date of Birth: 03/06/1956           MRN: [id number] Visit Date: 02/20/2018              Requested by: Jonathon BellowsMcGee, Rachel, DO [address] [place], [place] [phone] PCP: Jonathon BellowsMcGee, Rachel, DO   Assessment & Plan: Visit Diagnoses:  1. History of total right knee replacement     Plan: Danielle Schmitt is nearly 4 months status post primary right total knee replacement. She has been going to physical therapy and notes her knee is feeling "better and stronger". Still having some discomfort when she goes up and down stairs and feels like she is still  A bit weak but she feels that she needs to return to work. When she has pain  it is experienced along the lateral aspect of her knee. I'm going to inject the area of tenderness laterally . There Is no evidence of instability with a varus and valgus stress. There is a little bit of  anterior and posterior drawer sign. Hopefully this will improve with continued strengthening exercises. I still think her quads and hamstrings are somewhat weak. I will apply a spider brace and complete her paperwork to allow her to return to work on Monday. Plan to see her back in the next 6-8 weeks or sooner if there is a problem.  Follow-Up Instructions: Return in about 2 months (around 04/20/2018).   Orders:  Orders Placed This Encounter  Procedures  . Large Joint Inj: R knee   No orders of the defined types were placed in this encounter.     Procedures: Large Joint Inj: R knee on 02/20/2018 10:08 AM Indications: pain and diagnostic evaluation Details: 25 G 1.5 in needle, anterolateral approach  Arthrogram: No  Medications: 2 mL lidocaine 1 %; 2 mL bupivacaine 0.5 %; 80 mg methylPREDNISolone acetate 40 MG/ML Procedure, treatment alternatives, risks and benefits explained, specific risks discussed. Consent was given by the patient. Immediately prior to procedure a time out was called to verify the correct patient, procedure,  equipment, support staff and site/side marked as required. Patient was prepped and draped in the usual sterile fashion.       Clinical Data: No additional findings.   Subjective: Chief Complaint  Patient presents with  . Right Knee - Routine Post Op    Ms. Sienkiewicz is a 62 y o S/P 3 months Right TKA.   Nearly 4 months that is post primary right total knee replacement. Feeling better with therapy. Gaining strength. Some discomfort with certain activities particularly going down stairs and has to walk one step at a time. When she does experience pain is along the lateral aspect of her knee. No fever or chills  HPI  Review of Systems  Constitutional: Negative for chills, fatigue and fever.  HENT: Negative for hearing loss and tinnitus.   Eyes: Negative for itching.  Respiratory: Negative for chest tightness and shortness of breath.   Cardiovascular: Negative for chest pain, palpitations and leg swelling.  Gastrointestinal: Negative for blood in stool, constipation and diarrhea.  Endocrine: Negative for polyuria.  Genitourinary: Negative for dysuria.  Musculoskeletal: Positive for joint swelling. Negative for back pain, neck pain and neck stiffness.  Allergic/Immunologic: Negative for immunocompromised state.  Neurological: Positive for weakness. Negative for dizziness, numbness and headaches.  Hematological: Does not bruise/bleed easily.  Psychiatric/Behavioral: Negative for sleep disturbance. The patient  is not nervous/anxious.      Objective: Vital Signs: There were no vitals taken for this visit.  Physical Exam  Ortho Exam awake alert and oriented 3. Comfortable sitting right knee incision healing without problem. No opening with a varus valgus stress. Does have some laxity with anterior and posterior drawer. There is some tenderness along the posterior lateral joint line. No effusion. No increased heat. No calf pain or popliteal discomfort. No distal edema. Straight leg raise  negative. No hip discomfort with range of motion  Specialty Comments:  No specialty comments available.  Imaging: No results found.   PMFS History: Patient Active Problem List   Diagnosis Date Noted  . Primary osteoarthritis of right knee 10/30/2017  . S/P TKR (total knee replacement) using cement, right 10/30/2017  . Postoperative anemia due to acute blood loss 06/19/2013  . Osteoarthritis of left hip 06/17/2013  . Morbid obesity (HCC) 06/17/2013  . Asthma 06/17/2013   Past Medical History:  Diagnosis Date  . Arthritis    "shoulders, knees, fingers, hips" (10/31/2017)  . Asthma   . B12 deficiency anemia   . Chronic lower back pain   . Complication of anesthesia    Hx: of bronchospasm attack during procedure- hysterectomy  . Degenerative joint disease   . Depression   . Environmental allergies   . History of blood transfusion    left knee replacement and hip replacement  . Hypertension   . Incontinence of urine    "due to vaginal hysterectomy; I have pessary in place" (10/31/2017)  . Iron deficiency anemia   . Migraine    "any time weather/pressure changes"   . Pneumonia    "several times; last time was ~ 2014" (10/31/2017)  . Pseudogout of knee, right   . Spinal headache    ;Hx: of headache with Epidural; migraines    Family History  Problem Relation Age of Onset  . Cancer - Lung Mother   . Hypertension Mother   . Hypertension Father   . Kidney failure Father   . Hypertension Brother     Past Surgical History:  Procedure Laterality Date  . BACK SURGERY    . CARPAL TUNNEL RELEASE Bilateral   . COLONOSCOPY W/ BIOPSIES AND POLYPECTOMY     Hx: of  . JOINT REPLACEMENT    . KNEE ARTHROPLASTY Left   . KNEE ARTHROSCOPY Bilateral "numerous "  . KNEE RECONSTRUCTION Right    Insoll procedure  . SHOULDER ARTHROSCOPY W/ ROTATOR CUFF REPAIR Right   . SYNOVIAL CYST EXCISION     "inside spinal column"  . TONSILLECTOMY AND ADENOIDECTOMY  1998  . TOTAL HIP ARTHROPLASTY  Left 06/17/2013   Procedure: LEFT TOTAL HIP ARTHROPLASTY;  Surgeon: Valeria Batman, MD;  Location: [facility] OR;  Service: Orthopedics;  Laterality: Left;  . TOTAL KNEE ARTHROPLASTY Right 10/30/2017   Procedure: RIGHT TOTAL KNEE ARTHROPLASTY;  Surgeon: Valeria Batman, MD;  Location: [facility];  Service: Orthopedics;  Laterality: Right;  . TOTAL KNEE ARTHROPLASTY Right 10/30/2017  . VAGINAL HYSTERECTOMY     total   Social History   Occupational History  . Not on file  Tobacco Use  . Smoking status: Never Smoker  . Smokeless tobacco: Never Used  Substance and Sexual Activity  . Alcohol use: Yes    Comment: 10/31/2017 "might have 3 drinks/year"  . Drug use: No  . Sexual activity: No

## 2018-04-03 ENCOUNTER — Encounter (INDEPENDENT_AMBULATORY_CARE_PROVIDER_SITE_OTHER): Payer: Self-pay | Admitting: Orthopaedic Surgery

## 2018-04-03 ENCOUNTER — Ambulatory Visit (INDEPENDENT_AMBULATORY_CARE_PROVIDER_SITE_OTHER): Payer: BLUE CROSS/BLUE SHIELD | Admitting: Orthopaedic Surgery

## 2018-04-03 VITALS — BP 110/71 | HR 74 | Resp 18 | Ht 63.0 in | Wt 215.0 lb

## 2018-04-03 DIAGNOSIS — Z96651 Presence of right artificial knee joint: Secondary | ICD-10-CM | POA: Diagnosis not present

## 2018-04-03 NOTE — Progress Notes (Signed)
Office Visit Note   Patient: Danielle Schmitt           Date of Birth: 03/23/1956           MRN: [id number] Visit Date: 04/03/2018              Requested by: Jonathon BellowsMcGee, Rachel, DO [address] [place], [place] [id number] PCP: Jonathon BellowsMcGee, Rachel, DO   Assessment & Plan: Visit Diagnoses:  1. History of total right knee replacement     Plan: 4 months status post right total knee replacement and doing well and working out with physical therapy knee feeling much more stable.  We will plan to see her back in 6 months.  Urged continuation of strengthening exercises.  I filled out a form for her work relating that  there are no restrictions  Follow-Up Instructions: Return in about 6 months (around 10/03/2018).   Orders:  No orders of the defined types were placed in this encounter.  No orders of the defined types were placed in this encounter.     Procedures: No procedures performed   Clinical Data: No additional findings.   Subjective: No chief complaint on file. Danielle Schmitt is doing very well just over 4 months status post right total knee replacement.  She is feeling much better in terms of getting up and down in and out of the chair.  She is been working with physical therapy and feels like she is much stronger.  Still having a little discomfort along the lateral aspect of her knee with extension eyes fever or chills  HPI  Review of Systems  Constitutional: Negative for fatigue.  HENT: Negative for ear pain.   Eyes: Negative for pain.  Respiratory: Negative for cough.   Cardiovascular: Negative for leg swelling.  Gastrointestinal: Negative for constipation and diarrhea.  Genitourinary: Negative for difficulty urinating.  Musculoskeletal: Negative for back pain.  Skin: Positive for rash.  Allergic/Immunologic: Negative for food allergies.  Neurological: Negative for weakness and numbness.  Hematological: Does not bruise/bleed easily.  Psychiatric/Behavioral: Negative for sleep disturbance.      Objective: Vital Signs: There were no vitals taken for this visit.  Physical Exam  Ortho Exam awake alert and oriented x3.  Comfortable sitting.  Able to get up and down easily from a low sitting full extension at over 100 degrees of flexion.  No opening with varus or valgus stress.  The anterior drawer sign is less than it was February.  No effusion.  Minimal discomfort along the lateral joint line or clicking.  Neurovascular exam intact.  Is much better definition of her quads    Specialty Comments:  No specialty comments available.  Imaging: No results found.   PMFS History: Patient Active Problem List   Diagnosis Date Noted  . Primary osteoarthritis of right knee 10/30/2017  . S/P TKR (total knee replacement) using cement, right 10/30/2017  . Postoperative anemia due to acute blood loss 06/19/2013  . Osteoarthritis of left hip 06/17/2013  . Morbid obesity (HCC) 06/17/2013  . Asthma 06/17/2013   Past Medical History:  Diagnosis Date  . Arthritis    "shoulders, knees, fingers, hips" (10/31/2017)  . Asthma   . B12 deficiency anemia   . Chronic lower back pain   . Complication of anesthesia    Hx: of bronchospasm attack during procedure- hysterectomy  . Degenerative joint disease   . Depression   . Environmental allergies   . History of blood transfusion    left knee  replacement and hip replacement  . Hypertension   . Incontinence of urine    "due to vaginal hysterectomy; I have pessary in place" (10/31/2017)  . Iron deficiency anemia   . Migraine    "any time weather/pressure changes"   . Pneumonia    "several times; last time was ~ 2014" (10/31/2017)  . Pseudogout of knee, right   . Spinal headache    ;Hx: of headache with Epidural; migraines    Family History  Problem Relation Age of Onset  . Cancer - Lung Mother   . Hypertension Mother   . Hypertension Father   . Kidney failure Father   . Hypertension Brother     Past Surgical History:  Procedure  Laterality Date  . BACK SURGERY    . CARPAL TUNNEL RELEASE Bilateral   . COLONOSCOPY W/ BIOPSIES AND POLYPECTOMY     Hx: of  . JOINT REPLACEMENT    . KNEE ARTHROPLASTY Left   . KNEE ARTHROSCOPY Bilateral "numerous "  . KNEE RECONSTRUCTION Right    Insoll procedure  . SHOULDER ARTHROSCOPY W/ ROTATOR CUFF REPAIR Right   . SYNOVIAL CYST EXCISION     "inside spinal column"  . TONSILLECTOMY AND ADENOIDECTOMY  1998  . TOTAL HIP ARTHROPLASTY Left 06/17/2013   Procedure: LEFT TOTAL HIP ARTHROPLASTY;  Surgeon: Valeria Batman, MD;  Location: [facility] OR;  Service: Orthopedics;  Laterality: Left;  . TOTAL KNEE ARTHROPLASTY Right 10/30/2017   Procedure: RIGHT TOTAL KNEE ARTHROPLASTY;  Surgeon: Valeria Batman, MD;  Location: [facility] OR;  Service: Orthopedics;  Laterality: Right;  . TOTAL KNEE ARTHROPLASTY Right 10/30/2017  . VAGINAL HYSTERECTOMY     total   Social History   Occupational History  . Not on file  Tobacco Use  . Smoking status: Never Smoker  . Smokeless tobacco: Never Used  Substance and Sexual Activity  . Alcohol use: Yes    Comment: 10/31/2017 "might have 3 drinks/year"  . Drug use: No  . Sexual activity: Never     Valeria Batman, MD   Note - This record has been created using [employer].  Chart creation errors have been sought, but may not always  have been located. Such creation errors do not reflect on  the standard of medical care.

## 2018-07-03 ENCOUNTER — Encounter (INDEPENDENT_AMBULATORY_CARE_PROVIDER_SITE_OTHER): Payer: Self-pay | Admitting: Orthopaedic Surgery

## 2018-07-03 ENCOUNTER — Ambulatory Visit (INDEPENDENT_AMBULATORY_CARE_PROVIDER_SITE_OTHER): Payer: Self-pay

## 2018-07-03 ENCOUNTER — Ambulatory Visit (INDEPENDENT_AMBULATORY_CARE_PROVIDER_SITE_OTHER): Payer: BLUE CROSS/BLUE SHIELD | Admitting: Orthopaedic Surgery

## 2018-07-03 VITALS — BP 113/70 | HR 74 | Ht 63.0 in | Wt 215.0 lb

## 2018-07-03 DIAGNOSIS — G8929 Other chronic pain: Secondary | ICD-10-CM | POA: Diagnosis not present

## 2018-07-03 DIAGNOSIS — M25561 Pain in right knee: Secondary | ICD-10-CM | POA: Diagnosis not present

## 2018-07-03 DIAGNOSIS — Z96651 Presence of right artificial knee joint: Secondary | ICD-10-CM

## 2018-07-03 MED ORDER — METHYLPREDNISOLONE ACETATE 40 MG/ML IJ SUSP
80.0000 mg | INTRAMUSCULAR | Status: AC | PRN
  Administered 2018-07-03: 80 mg

## 2018-07-03 MED ORDER — BUPIVACAINE HCL 0.5 % IJ SOLN
2.0000 mL | INTRAMUSCULAR | Status: AC | PRN
  Administered 2018-07-03: 2 mL via INTRA_ARTICULAR

## 2018-07-03 MED ORDER — LIDOCAINE HCL 1 % IJ SOLN
2.0000 mL | INTRAMUSCULAR | Status: AC | PRN
  Administered 2018-07-03: 2 mL

## 2018-07-03 NOTE — Progress Notes (Signed)
Office Visit Note   Patient: Danielle Schmitt           Date of Birth: 11/16/1956           MRN: [id number] Visit Date: 07/03/2018              Requested by: Jonathon BellowsMcGee, Rachel, DO [address] [place], [place] [id number] PCP: Jonathon BellowsMcGee, Rachel, DO   Assessment & Plan: Visit Diagnoses:  1. Chronic pain of right knee   2. History of total right knee replacement     Plan: Tenderness over the lateral aspect right total knee replacement could be related to the ectopic calcification over the femoral condyle tibial plateau.  Will inject these areas and monitor response.  Urged her to continue with her exercises.  We will also prescribe Voltaren gel.  I do not see any abnormalities with the prosthesis by plain films today Follow-Up Instructions: Return in about 2 months (around 09/03/2018).   Orders:  Orders Placed This Encounter  Procedures  . Large Joint Inj: R knee  . XR KNEE 3 VIEW RIGHT   No orders of the defined types were placed in this encounter.     Procedures: Large Joint Inj: R knee on 07/03/2018 11:29 AM Indications: pain and diagnostic evaluation Details: 25 G 1.5 in needle, lateral approach  Arthrogram: No  Medications: 2 mL lidocaine 1 %; 2 mL bupivacaine 0.5 %; 80 mg methylPREDNISolone acetate 40 MG/ML  Area of tenderness over lat fem condyle and lat tibial plateau injected Procedure, treatment alternatives, risks and benefits explained, specific risks discussed. Consent was given by the patient. Immediately prior to procedure a time out was called to verify the correct patient, procedure, equipment, support staff and site/side marked as required. Patient was prepped and draped in the usual sterile fashion.       Clinical Data: No additional findings.   Subjective: Chief Complaint  Patient presents with  . Follow-up    R KNEE PAIN HAD REPLACEMENT 10/30/2017 BUT GETTING WORSE LAST 2 WEEKS, CAUSING LIMP TO WALK  Danielle Schmitt is status post uncomplicated right total knee replacement  in November 2018.  She was doing well up to the her last office visit several months ago.  She is developed some discomfort over the lateral aspect of her knee.  She had some mild discomfort in that same area when I saw her several months ago when I injected cortisone with some relief of her pain.  She has not had any injury or trauma.  She is been through a course of physical therapy.  Sometimes she limps.  She does not have a feeling of her knee giving way.  She denies fever or chills.  HPI  Review of Systems  Constitutional: Positive for fatigue. Negative for fever.  HENT: Positive for ear pain.   Eyes: Negative for pain.  Respiratory: Positive for shortness of breath. Negative for cough.   Cardiovascular: Positive for leg swelling.  Gastrointestinal: Negative for constipation and diarrhea.  Genitourinary: Negative for difficulty urinating.  Musculoskeletal: Positive for back pain. Negative for neck pain.  Skin: Negative for rash.  Allergic/Immunologic: Negative for food allergies.  Neurological: Positive for weakness and numbness.  Hematological: Bruises/bleeds easily.  Psychiatric/Behavioral: Positive for sleep disturbance.     Objective: Vital Signs: BP 113/70 (BP Location: Right Arm, Patient Position: Sitting, Cuff Size: Normal)   Pulse 74   Ht 5\' 3"  (1.6 m)   Wt 215 lb (97.5 kg)   BMI 38.09 kg/m  Physical Exam  Constitutional: She is oriented to person, place, and time. She appears well-developed and well-nourished.  HENT:  Mouth/Throat: Oropharynx is clear and moist.  Eyes: Pupils are equal, round, and reactive to light. EOM are normal.  Pulmonary/Chest: Effort normal.  Neurological: She is alert and oriented to person, place, and time.  Skin: Skin is warm and dry.  Psychiatric: She has a normal mood and affect. Her behavior is normal.    Ortho Exam awake alert and oriented x3.  Comfortable sitting.  Examination of the right knee revealed no evidence of effusion.   Full extension and may be slight hyperextension with flexion approximately 110 degrees with a goniometer.  No opening with a varus or valgus stress compared to the left knee.  Calf pain.  No distal edema.  Neurovascular exam intact.  Some crepitation and discomfort over the lateral femoral condyle and lateral tibial plateau.  The lateral collateral ligament is intact and opens minimally pain about the patella.  No pain medially.  Did not reveal any obvious abnormality of the prosthesis laterally.  There is some ectopic calcification which might be responsible for her pain  Specialty Comments:  No specialty comments available.  Imaging: Xr Knee 3 View Right  Result Date: 07/03/2018 Of the right total knee repeat obtained in 3 positions.  There is some mild ectopic calcification along the lateral femoral condyle and lateral tibial plateau.  The prosthesis appears to be intact without evidence of any abnormality.  Good glue mantle.  Patient is locally symptomatic over the lateral femoral condyle and lateral tibial plateau be related to the ectopic calcification    PMFS History: Patient Active Problem List   Diagnosis Date Noted  . Primary osteoarthritis of right knee 10/30/2017  . S/P TKR (total knee replacement) using cement, right 10/30/2017  . Postoperative anemia due to acute blood loss 06/19/2013  . Osteoarthritis of left hip 06/17/2013  . Morbid obesity (HCC) 06/17/2013  . Asthma 06/17/2013   Past Medical History:  Diagnosis Date  . Arthritis    "shoulders, knees, fingers, hips" (10/31/2017)  . Asthma   . B12 deficiency anemia   . Chronic lower back pain   . Complication of anesthesia    Hx: of bronchospasm attack during procedure- hysterectomy  . Degenerative joint disease   . Depression   . Environmental allergies   . History of blood transfusion    left knee replacement and hip replacement  . Hypertension   . Incontinence of urine    "due to vaginal hysterectomy; I have  pessary in place" (10/31/2017)  . Iron deficiency anemia   . Migraine    "any time weather/pressure changes"   . Pneumonia    "several times; last time was ~ 2014" (10/31/2017)  . Pseudogout of knee, right   . Spinal headache    ;Hx: of headache with Epidural; migraines    Family History  Problem Relation Age of Onset  . Cancer - Lung Mother   . Hypertension Mother   . Hypertension Father   . Kidney failure Father   . Hypertension Brother     Past Surgical History:  Procedure Laterality Date  . BACK SURGERY    . CARPAL TUNNEL RELEASE Bilateral   . COLONOSCOPY W/ BIOPSIES AND POLYPECTOMY     Hx: of  . JOINT REPLACEMENT    . KNEE ARTHROPLASTY Left   . KNEE ARTHROSCOPY Bilateral "numerous "  . KNEE RECONSTRUCTION Right    Insoll procedure  . SHOULDER  ARTHROSCOPY W/ ROTATOR CUFF REPAIR Right   . SYNOVIAL CYST EXCISION     "inside spinal column"  . TONSILLECTOMY AND ADENOIDECTOMY  1998  . TOTAL HIP ARTHROPLASTY Left 06/17/2013   Procedure: LEFT TOTAL HIP ARTHROPLASTY;  Surgeon: Valeria Batman, MD;  Location: [facility] OR;  Service: Orthopedics;  Laterality: Left;  . TOTAL KNEE ARTHROPLASTY Right 10/30/2017   Procedure: RIGHT TOTAL KNEE ARTHROPLASTY;  Surgeon: Valeria Batman, MD;  Location: [facility] OR;  Service: Orthopedics;  Laterality: Right;  . TOTAL KNEE ARTHROPLASTY Right 10/30/2017  . VAGINAL HYSTERECTOMY     total   Social History   Occupational History  . Not on file  Tobacco Use  . Smoking status: Never Smoker  . Smokeless tobacco: Never Used  Substance and Sexual Activity  . Alcohol use: Yes    Comment: 10/31/2017 "might have 3 drinks/year"  . Drug use: No  . Sexual activity: Never

## 2018-07-04 ENCOUNTER — Telehealth (INDEPENDENT_AMBULATORY_CARE_PROVIDER_SITE_OTHER): Payer: Self-pay | Admitting: *Deleted

## 2018-07-04 ENCOUNTER — Other Ambulatory Visit (INDEPENDENT_AMBULATORY_CARE_PROVIDER_SITE_OTHER): Payer: Self-pay | Admitting: Radiology

## 2018-07-04 DIAGNOSIS — M25561 Pain in right knee: Principal | ICD-10-CM

## 2018-07-04 DIAGNOSIS — G8929 Other chronic pain: Secondary | ICD-10-CM

## 2018-07-04 MED ORDER — DICLOFENAC SODIUM 1 % TD GEL: TRANSDERMAL | 2 refills | Status: DC

## 2018-07-04 NOTE — Telephone Encounter (Signed)
Pt called left vm on triage stating saw Dr. Ophelia CharterYates yesterday and had prescribe Voltaren gel and she called her pharmacy and they did not have it. She wants to make sure it went to the right pharmacy, [facility] [place]. Please advise.

## 2018-07-04 NOTE — Telephone Encounter (Signed)
Notified pt rx was called into correct pharmacy

## 2019-01-06 ENCOUNTER — Other Ambulatory Visit (INDEPENDENT_AMBULATORY_CARE_PROVIDER_SITE_OTHER): Payer: Self-pay | Admitting: *Deleted

## 2019-01-06 MED ORDER — DICLOFENAC SODIUM 1 % TD GEL: TRANSDERMAL | 2 refills | Status: DC

## 2019-07-10 IMAGING — CR DG CHEST 2V
2 series · 2 of 2 positions shown · non-contrast
Comparison: Prior radiograph from 06/18/2013.

CLINICAL DATA: Initial evaluation for preoperative evaluation.
History of pneumonia, asthma.

EXAM:
CHEST  2 VIEW

[w chest pa]
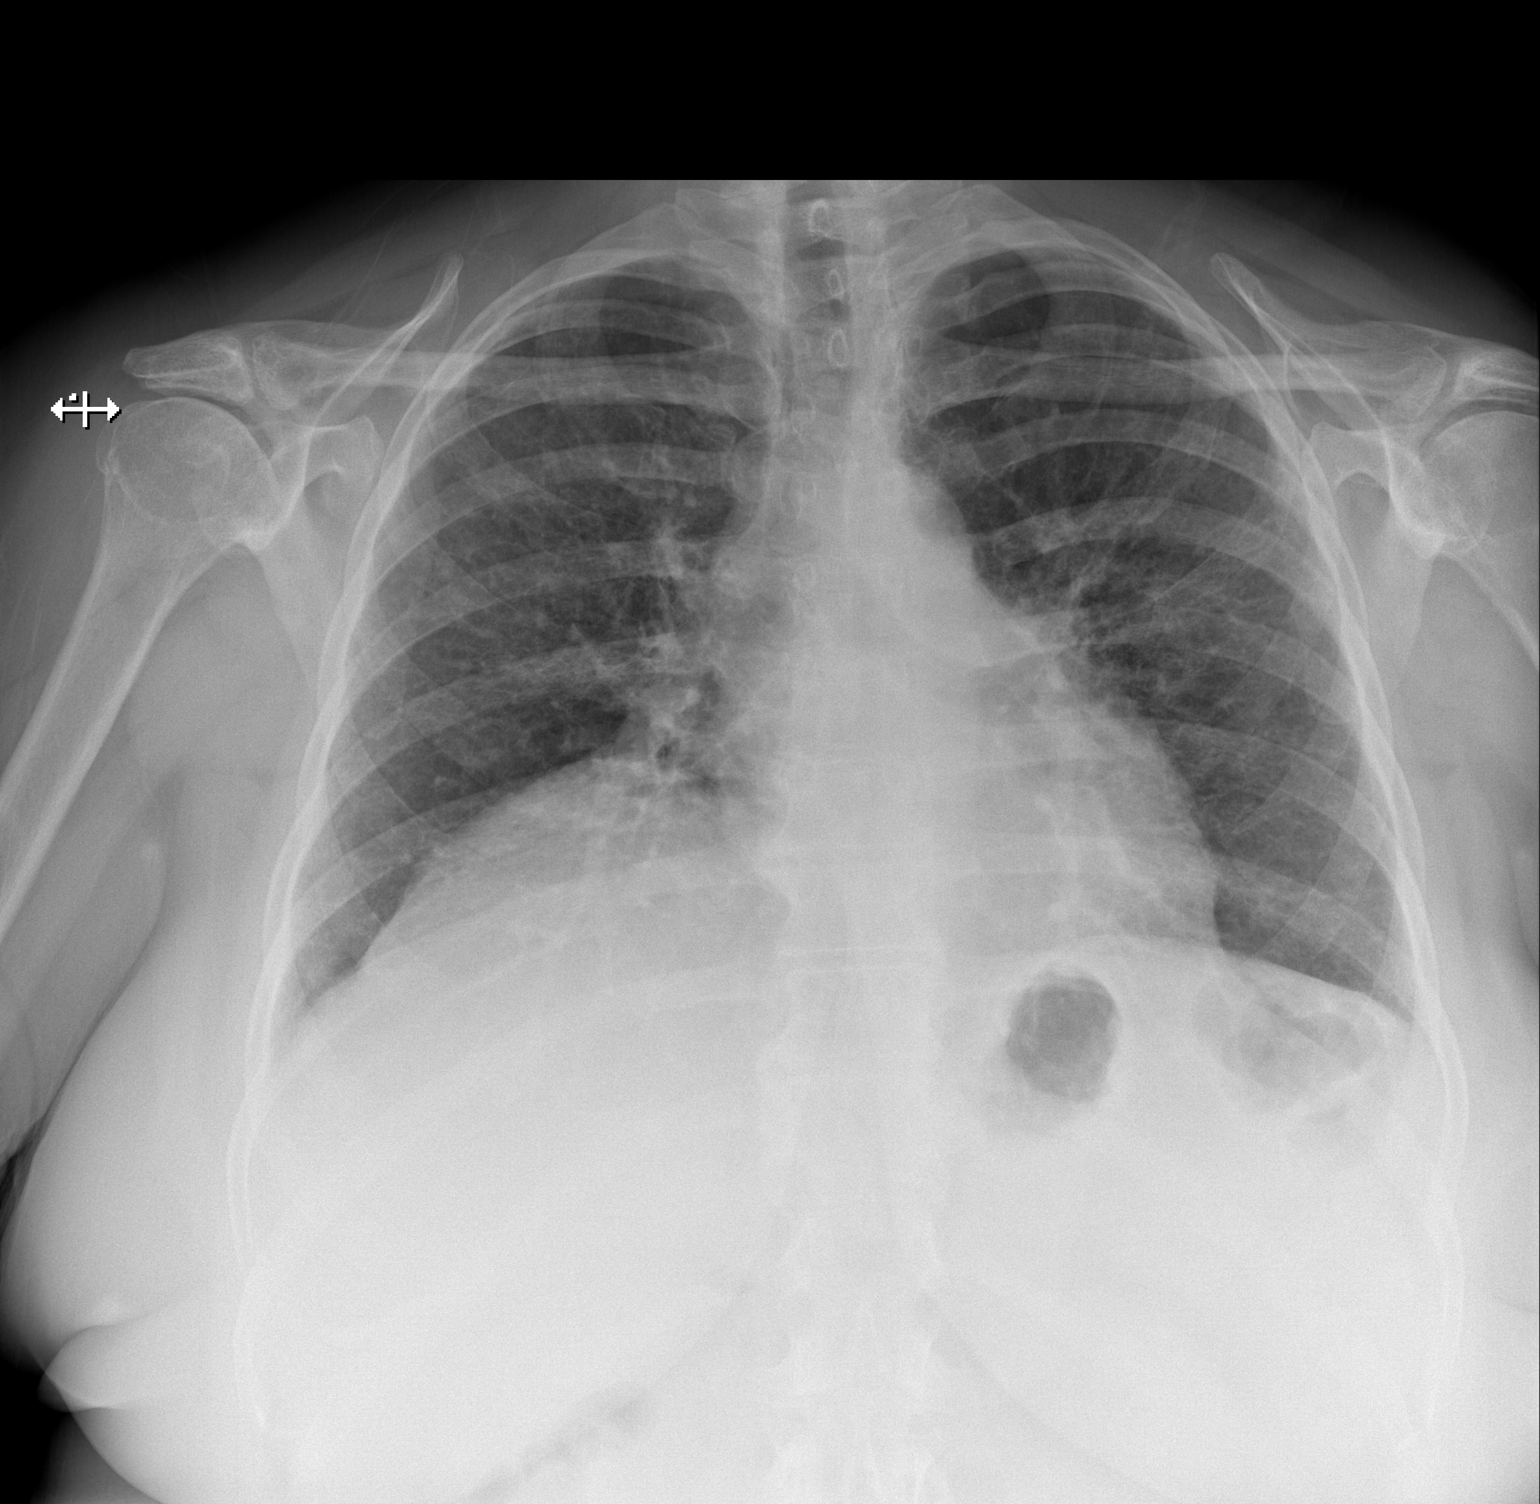

[w chest lat]
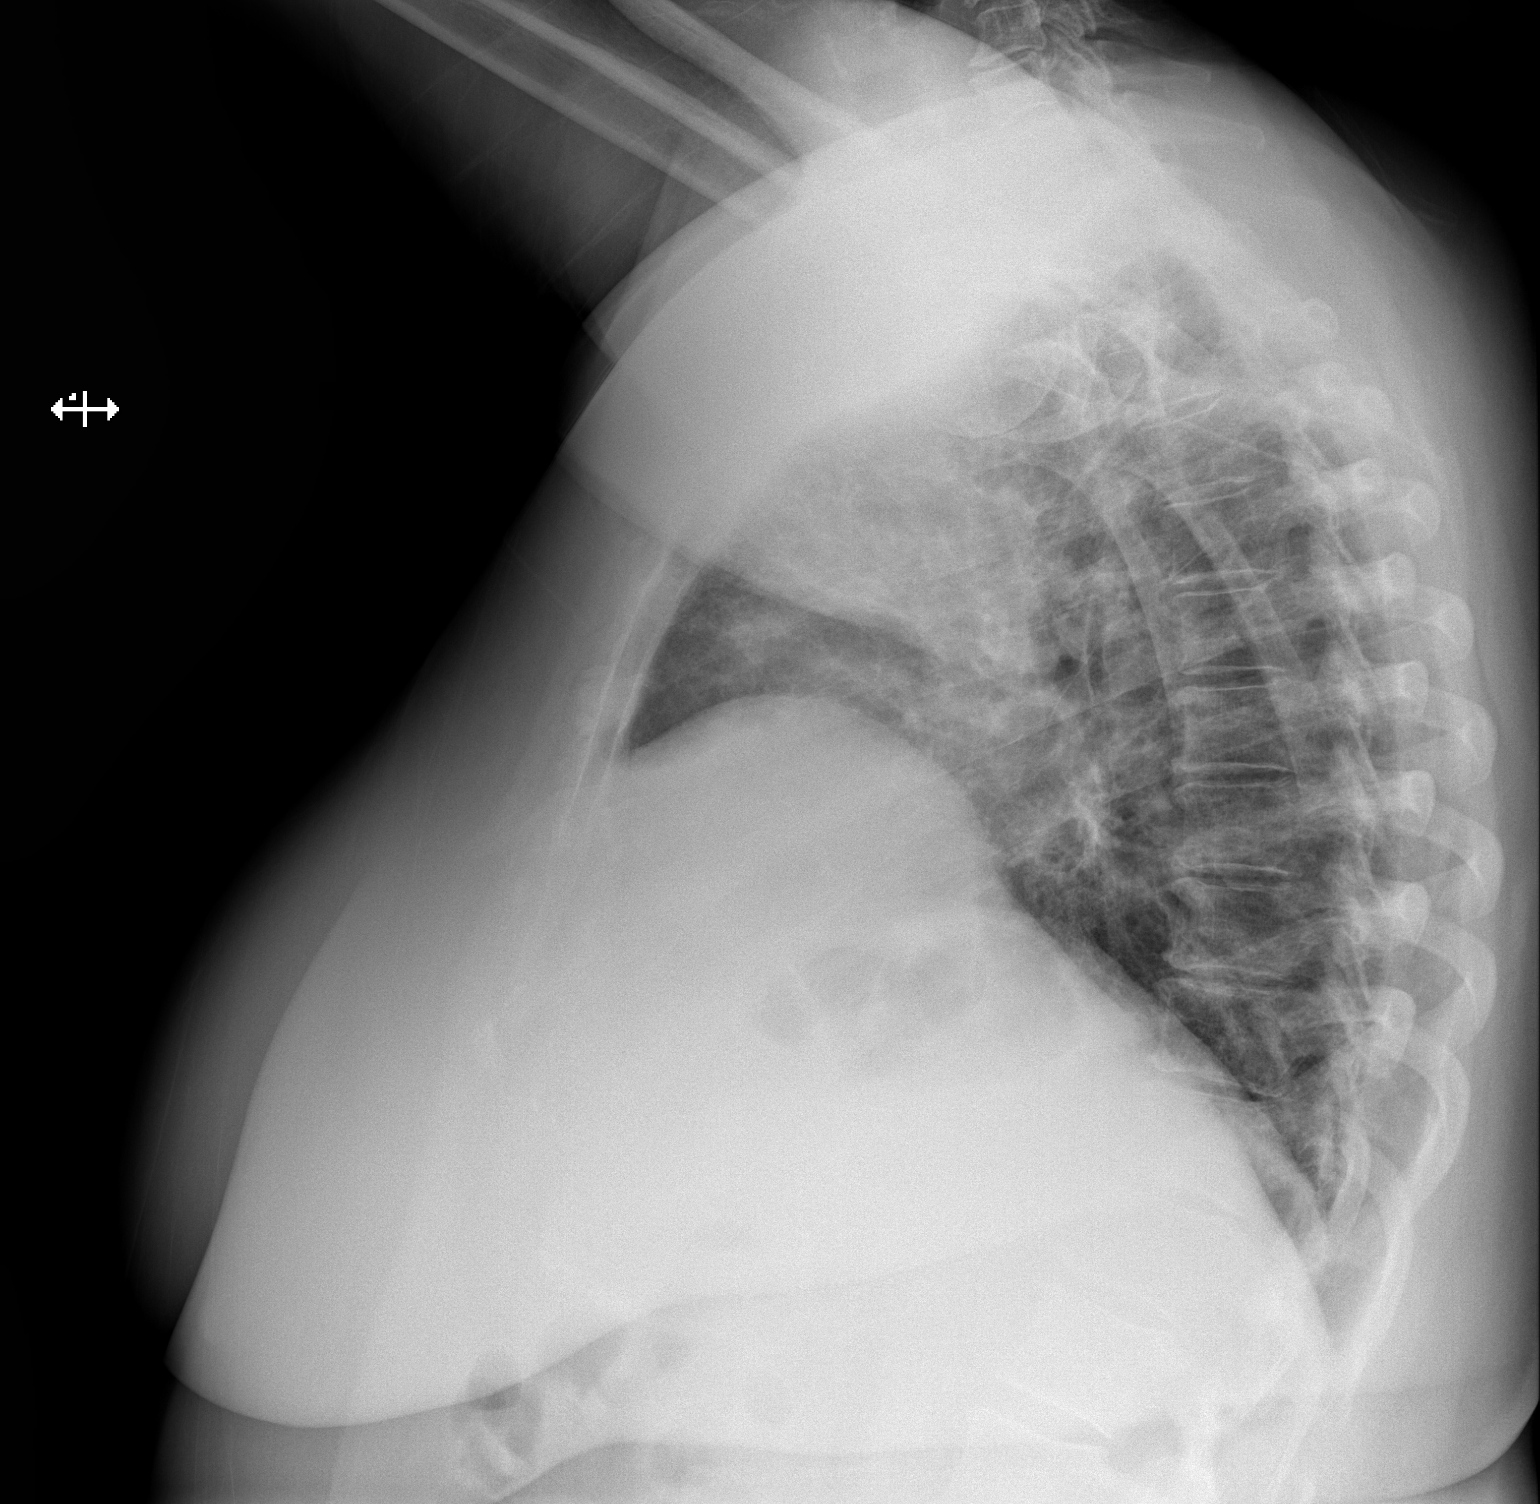

[2 of 2 positions shown; findings below may reference images not displayed]

FINDINGS: Cardiac and mediastinal silhouettes are stable in size and contour,
and remain within normal limits.

Elevation of the right hemidiaphragm, similar to previous, likely
chronic. Patchy right basilar opacity favored to reflect
atelectasis, although infiltrate could be considered in the correct
clinical setting. Underlying scattered diffuse peribronchial
thickening suggestive of changes related asthma and/ or
bronchiolitis. No pulmonary edema or pleural effusion. No
pneumothorax.

No acute osseus abnormality.
IMPRESSION: 1. Chronic elevation of the right hemidiaphragm. Associated right
basilar opacity likely reflects atelectasis/bronchovascular
crowding. Infectious infiltrate could be considered in the correct
clinical setting.
2. Underlying scattered peribronchial thickening, suspected be
related history of asthma. Active bronchiolitis could also be
considered.

## 2020-06-09 ENCOUNTER — Ambulatory Visit: Payer: Self-pay

## 2020-06-09 ENCOUNTER — Other Ambulatory Visit: Payer: Self-pay

## 2020-06-09 ENCOUNTER — Encounter: Payer: Self-pay | Admitting: Orthopaedic Surgery

## 2020-06-09 ENCOUNTER — Ambulatory Visit (INDEPENDENT_AMBULATORY_CARE_PROVIDER_SITE_OTHER): Payer: BLUE CROSS/BLUE SHIELD | Admitting: Orthopaedic Surgery

## 2020-06-09 VITALS — Ht 63.0 in | Wt 221.0 lb

## 2020-06-09 DIAGNOSIS — M25551 Pain in right hip: Secondary | ICD-10-CM | POA: Diagnosis not present

## 2020-06-09 NOTE — Addendum Note (Signed)
Addended by: Wendi Maya on: 06/09/2020 09:43 AM   Modules accepted: Orders

## 2020-06-09 NOTE — Progress Notes (Signed)
Office Visit Note   Patient: Danielle Schmitt           Date of Birth: 02/17/1956           MRN: [id number] Visit Date: 06/09/2020              Requested by: Sherrilee Gilles, [place] [address] PCP: Sherrilee Gilles, DO   Assessment & Plan: Visit Diagnoses:  1. Pain in right hip     Plan: Right hip pain appears to be related to the osteoarthritis.  2 weeks ago had an intra-articular cortisone injection by her pain clinic physician in [place] with only about 10 to 12 days of relief.  X-rays were negative for any acute changes.  I am a little concerned she is having more pain than I would expect from just arthritis so we will order a CT scan of her hip.  I suspect the real issue is with the arthritis and she may be a candidate for hip replacement pending results of the CT scan.  She presently is on gabapentin, Robaxin and tramadol through her pain clinic  Follow-Up Instructions: Return After CT scan right hip.   Orders:  Orders Placed This Encounter  Procedures  . XR HIP UNILAT W OR W/O PELVIS 2-3 VIEWS RIGHT   No orders of the defined types were placed in this encounter.     Procedures: No procedures performed   Clinical Data: No additional findings.   Subjective: Chief Complaint  Patient presents with  . Right Hip - Pain  Patient presents today for right hip pain. She states that Memorial Day weekend she felt a "crunch" in her hip and has been unable to bear weight since. Her pain is located in her groin. She is unable to get in a position that is pain free. She is currently in a pain management in [place].  Her pain physician injected her hip several weeks ago with only temporary relief of her pain.  Her pain feels similar to the discomfort on the left prior to her hip replacement.  She is doing well with her left hip replacement.  She is presently on gabapentin, Robaxin and tramadol and has history of chronic back pain  HPI  Review of  Systems   Objective: Vital Signs: Ht 5\' 3"  (1.6 m)   Wt 221 lb (100.2 kg)   BMI 39.15 kg/m   Physical Exam Constitutional:      Appearance: She is well-developed.  Eyes:     Pupils: Pupils are equal, round, and reactive to light.  Pulmonary:     Effort: Pulmonary effort is normal.  Skin:    General: Skin is warm and dry.  Neurological:     Mental Status: She is alert and oriented to person, place, and time.  Psychiatric:        Behavior: Behavior normal.     Ortho Exam awake alert and oriented x3.  Comfortable sitting.  Uses a cane to aid with her ambulation.  Has significant pain with internal and external rotation of her right hip.  Leg lengths appear to be symmetrical.  Motor and sensory exam appears to be intact straight leg raise negative.  Painless range of motion of left hip  Specialty Comments:  No specialty comments available.  Imaging: XR HIP UNILAT W OR W/O PELVIS 2-3 VIEWS RIGHT  Result Date: 06/09/2020 Films of the right hip were obtained in 2 projections.  There are degenerative changes with  narrowing of the hip joint and peripheral osteophytes.  There are some areas of subchondral sclerosis.  I do not see any acute changes total hip replacement on the left in good position.    PMFS History: Patient Active Problem List   Diagnosis Date Noted  . Pain in right hip 06/09/2020  . Primary osteoarthritis of right knee 10/30/2017  . S/P TKR (total knee replacement) using cement, right 10/30/2017  . Postoperative anemia due to acute blood loss 06/19/2013  . Osteoarthritis of left hip 06/17/2013  . Morbid obesity (HCC) 06/17/2013  . Asthma 06/17/2013   Past Medical History:  Diagnosis Date  . Arthritis    "shoulders, knees, fingers, hips" (10/31/2017)  . Asthma   . B12 deficiency anemia   . Chronic lower back pain   . Complication of anesthesia    Hx: of bronchospasm attack during procedure- hysterectomy  . Degenerative joint disease   . Depression   .  Environmental allergies   . History of blood transfusion    left knee replacement and hip replacement  . Hypertension   . Incontinence of urine    "due to vaginal hysterectomy; I have pessary in place" (10/31/2017)  . Iron deficiency anemia   . Migraine    "any time weather/pressure changes"   . Pneumonia    "several times; last time was ~ 2014" (10/31/2017)  . Pseudogout of knee, right   . Spinal headache    ;Hx: of headache with Epidural; migraines    Family History  Problem Relation Age of Onset  . Cancer - Lung Mother   . Hypertension Mother   . Hypertension Father   . Kidney failure Father   . Hypertension Brother     Past Surgical History:  Procedure Laterality Date  . BACK SURGERY    . CARPAL TUNNEL RELEASE Bilateral   . COLONOSCOPY W/ BIOPSIES AND POLYPECTOMY     Hx: of  . JOINT REPLACEMENT    . KNEE ARTHROPLASTY Left   . KNEE ARTHROSCOPY Bilateral "numerous "  . KNEE RECONSTRUCTION Right    Insoll procedure  . SHOULDER ARTHROSCOPY W/ ROTATOR CUFF REPAIR Right   . SYNOVIAL CYST EXCISION     "inside spinal column"  . TONSILLECTOMY AND ADENOIDECTOMY  1998  . TOTAL HIP ARTHROPLASTY Left 06/17/2013   Procedure: LEFT TOTAL HIP ARTHROPLASTY;  Surgeon: Valeria Batman, MD;  Location: [facility] OR;  Service: Orthopedics;  Laterality: Left;  . TOTAL KNEE ARTHROPLASTY Right 10/30/2017   Procedure: RIGHT TOTAL KNEE ARTHROPLASTY;  Surgeon: Valeria Batman, MD;  Location: [facility] OR;  Service: Orthopedics;  Laterality: Right;  . TOTAL KNEE ARTHROPLASTY Right 10/30/2017  . VAGINAL HYSTERECTOMY     total   Social History   Occupational History  . Not on file  Tobacco Use  . Smoking status: Never Smoker  . Smokeless tobacco: Never Used  Vaping Use  . Vaping Use: Never used  Substance and Sexual Activity  . Alcohol use: Yes    Comment: 10/31/2017 "might have 3 drinks/year"  . Drug use: No  . Sexual activity: Never

## 2020-06-30 ENCOUNTER — Other Ambulatory Visit: Payer: Self-pay

## 2020-06-30 ENCOUNTER — Encounter: Payer: Self-pay | Admitting: Orthopaedic Surgery

## 2020-06-30 ENCOUNTER — Ambulatory Visit (INDEPENDENT_AMBULATORY_CARE_PROVIDER_SITE_OTHER): Payer: BC Managed Care – PPO | Admitting: Orthopaedic Surgery

## 2020-06-30 DIAGNOSIS — M160 Bilateral primary osteoarthritis of hip: Secondary | ICD-10-CM

## 2020-06-30 NOTE — Progress Notes (Signed)
Office Visit Note   Patient: Danielle Schmitt           Date of Birth: 1956-03-01           MRN: [id number] Visit Date: 06/30/2020              Requested by: Jonathon Bellows, DO [address] [place],  [place] [address] PCP: Jonathon Bellows, DO   Assessment & Plan: Visit Diagnoses:  1. Bilateral primary osteoarthritis of hip     Plan: Mrs. Hibler had a CT scan of her right hip demonstrating moderate to advanced degenerative changes.  Her symptoms and exam are consistent with arthritis.  We have had a long discussion regarding treatment options including hip replacement.  I think that she would be a good candidate for an anterior approach.  She lives in the [place] area and I am going to refer to Dr. Ophelia Charter.  Hopefully he can see her tomorrow when he is in need  Follow-Up Instructions: Return Will refer to Dr. Ophelia Charter for right total hip replacement from an anterior approach.   Orders:  No orders of the defined types were placed in this encounter.  No orders of the defined types were placed in this encounter.     Procedures: No procedures performed   Clinical Data: No additional findings.   Subjective: Chief Complaint  Patient presents with  . Right Hip - Follow-up    CT review  Patient presents today for follow up on her right hip. She had a CT scan on 06-21-2020. She is here today for those results. No changes in her hip. She also brought with her some results of other scans of her lower back earlier in the year.  CT scan of the right hip was performed in [place] on 06/21/2020.  The scan demonstrated severe joint space narrowing with marginal osteophytes of the right hip.  No significant joint effusion.  No evidence for acute fracture or dislocation. Continues to be followed in a [place] pain clinic for chronic pain related to her back.  Has had prior scan of her lumbar spine demonstrating areas of foraminal stenosis and arthritis. has had prior laminectomy  HPI  Review of  Systems   Objective: Vital Signs: There were no vitals taken for this visit.  Physical Exam Constitutional:      Appearance: She is well-developed.  Eyes:     Pupils: Pupils are equal, round, and reactive to light.  Pulmonary:     Effort: Pulmonary effort is normal.  Skin:    General: Skin is warm and dry.  Neurological:     Mental Status: She is alert and oriented to person, place, and time.  Psychiatric:        Behavior: Behavior normal.     Ortho Exam BMI 39.  Awake alert and oriented x3.  Has difficulty with internal and external rotation of her right hip related to pain.  There is limitation of motion of her right hip particularly in internal rotation.  Leg lengths appear to be symmetrical motor exam intact.  Specialty Comments:  No specialty comments available.  Imaging: No results found.   PMFS History: Patient Active Problem List   Diagnosis Date Noted  . Bilateral primary osteoarthritis of hip 06/30/2020  . Pain in right hip 06/09/2020  . Primary osteoarthritis of right knee 10/30/2017  . S/P TKR (total knee replacement) using cement, right 10/30/2017  . Postoperative anemia due to acute blood loss 06/19/2013  . Osteoarthritis of left  hip 06/17/2013  . Morbid obesity (HCC) 06/17/2013  . Asthma 06/17/2013   Past Medical History:  Diagnosis Date  . Arthritis    "shoulders, knees, fingers, hips" (10/31/2017)  . Asthma   . B12 deficiency anemia   . Chronic lower back pain   . Complication of anesthesia    Hx: of bronchospasm attack during procedure- hysterectomy  . Degenerative joint disease   . Depression   . Environmental allergies   . History of blood transfusion    left knee replacement and hip replacement  . Hypertension   . Incontinence of urine    "due to vaginal hysterectomy; I have pessary in place" (10/31/2017)  . Iron deficiency anemia   . Migraine    "any time weather/pressure changes"   . Pneumonia    "several times; last time was ~  2014" (10/31/2017)  . Pseudogout of knee, right   . Spinal headache    ;Hx: of headache with Epidural; migraines    Family History  Problem Relation Age of Onset  . Cancer - Lung Mother   . Hypertension Mother   . Hypertension Father   . Kidney failure Father   . Hypertension Brother     Past Surgical History:  Procedure Laterality Date  . BACK SURGERY    . CARPAL TUNNEL RELEASE Bilateral   . COLONOSCOPY W/ BIOPSIES AND POLYPECTOMY     Hx: of  . JOINT REPLACEMENT    . KNEE ARTHROPLASTY Left   . KNEE ARTHROSCOPY Bilateral "numerous "  . KNEE RECONSTRUCTION Right    Insoll procedure  . SHOULDER ARTHROSCOPY W/ ROTATOR CUFF REPAIR Right   . SYNOVIAL CYST EXCISION     "inside spinal column"  . TONSILLECTOMY AND ADENOIDECTOMY  1998  . TOTAL HIP ARTHROPLASTY Left 06/17/2013   Procedure: LEFT TOTAL HIP ARTHROPLASTY;  Surgeon: Valeria Batman, MD;  Location: [facility] OR;  Service: Orthopedics;  Laterality: Left;  . TOTAL KNEE ARTHROPLASTY Right 10/30/2017   Procedure: RIGHT TOTAL KNEE ARTHROPLASTY;  Surgeon: Valeria Batman, MD;  Location: [facility] OR;  Service: Orthopedics;  Laterality: Right;  . TOTAL KNEE ARTHROPLASTY Right 10/30/2017  . VAGINAL HYSTERECTOMY     total   Social History   Occupational History  . Not on file  Tobacco Use  . Smoking status: Never Smoker  . Smokeless tobacco: Never Used  Vaping Use  . Vaping Use: Never used  Substance and Sexual Activity  . Alcohol use: Yes    Comment: 10/31/2017 "might have 3 drinks/year"  . Drug use: No  . Sexual activity: Never

## 2020-07-01 ENCOUNTER — Encounter: Payer: Self-pay | Admitting: Orthopaedic Surgery

## 2020-07-01 ENCOUNTER — Ambulatory Visit (INDEPENDENT_AMBULATORY_CARE_PROVIDER_SITE_OTHER): Payer: BC Managed Care – PPO | Admitting: Orthopaedic Surgery

## 2020-07-01 DIAGNOSIS — M1611 Unilateral primary osteoarthritis, right hip: Secondary | ICD-10-CM

## 2020-07-01 NOTE — Progress Notes (Signed)
Office Visit Note   Patient: Danielle Schmitt           Date of Birth: 1956/07/24           MRN: [id number] Visit Date: 07/01/2020              Requested by: Jonathon Bellows, DO [address] [place],  [place] [address] PCP: Jonathon Bellows, DO   Assessment & Plan: Visit Diagnoses:  1. Unilateral primary osteoarthritis, right hip     Plan: Patient might proceed with right total hip arthroplasty direct anterior approach.  We discussed risks of surgery with femur fracture, infection, hip dislocation reoperation.  Anterior posterior approach differences were discussed and reviewed.  Questions were elicited and answered she understands request to proceed.  Follow-Up Instructions: No follow-ups on file.   Orders:  No orders of the defined types were placed in this encounter.  No orders of the defined types were placed in this encounter.     Procedures: No procedures performed   Clinical Data: No additional findings.   Subjective: Chief Complaint  Patient presents with  . Right Hip - Pain    HPI   64 yo female with right hip pain progressive with difficulty ambulating. CT scan showed advanced right hip osteoarthritis degenerative changes done in [place].  She has used a walker , been on NSAIDS and on chronic pain meds tramadol 50 mg  # 90 tabs as a time and also Gabapentin 100mg  # 180 monthly. She has had previous right TKA , left THA doing well. Increasing right groin pain that interfers with ambulation.  Previous lumbar lam . No neurogenic claudication symptoms.  Currently on Celebrex 200mg .  Patient was referred to me by Dr. for direct anterior approach for right total hip arthroplasty which is patient's request. Review of Systems positive for previous joint arthroplasty as listed above left ORIF arthroplasty right total knee arthroplasty.   Objective: Vital Signs: Ht 5\' 3"  (1.6 m)   Wt 220 lb (99.8 kg)   BMI 38.97 kg/m   Physical Exam Constitutional:       Appearance: She is well-developed.  HENT:     Head: Normocephalic.     Right Ear: External ear normal.     Left Ear: External ear normal.  Eyes:     Pupils: Pupils are equal, round, and reactive to light.  Neck:     Thyroid: No thyromegaly.     Trachea: No tracheal deviation.  Cardiovascular:     Rate and Rhythm: Normal rate.  Pulmonary:     Effort: Pulmonary effort is normal.  Abdominal:     Palpations: Abdomen is soft.  Skin:    General: Skin is warm and dry.  Neurological:     Mental Status: She is alert and oriented to person, place, and time.  Psychiatric:        Behavior: Behavior normal.     Ortho Exam patient is sharp right groin pain with internal rotation limited to 15 external rotation 30.  Opposite left hip has good hip range of motion without pain or discomfort.  Well-healed total knee arthroplasty midline incision.  Specialty Comments:  No specialty comments available.  Imaging: X-rays 06/09/2020 right hip demonstrate loss of joint space marginal osteophytes subchondral sclerosis 1 mm joint space and small subchondral cyst formation.  Well-positioned opposite left total hip arthroplasty with single acetabular screw.   PMFS History: Patient Active Problem List   Diagnosis Date Noted  . Unilateral primary  osteoarthritis, right hip 07/06/2020  . Bilateral primary osteoarthritis of hip 06/30/2020  . Pain in right hip 06/09/2020  . Primary osteoarthritis of right knee 10/30/2017  . S/P TKR (total knee replacement) using cement, right 10/30/2017  . Postoperative anemia due to acute blood loss 06/19/2013  . Osteoarthritis of left hip 06/17/2013  . Morbid obesity (HCC) 06/17/2013  . Asthma 06/17/2013   Past Medical History:  Diagnosis Date  . Arthritis    "shoulders, knees, fingers, hips" (10/31/2017)  . Asthma   . B12 deficiency anemia   . Chronic lower back pain   . Complication of anesthesia    Hx: of bronchospasm attack during procedure- hysterectomy   . Degenerative joint disease   . Depression   . Environmental allergies   . History of blood transfusion    left knee replacement and hip replacement  . Hypertension   . Incontinence of urine    "due to vaginal hysterectomy; I have pessary in place" (10/31/2017)  . Iron deficiency anemia   . Migraine    "any time weather/pressure changes"   . Pneumonia    "several times; last time was ~ 2014" (10/31/2017)  . Pseudogout of knee, right   . Spinal headache    ;Hx: of headache with Epidural; migraines    Family History  Problem Relation Age of Onset  . Cancer - Lung Mother   . Hypertension Mother   . Hypertension Father   . Kidney failure Father   . Hypertension Brother     Past Surgical History:  Procedure Laterality Date  . BACK SURGERY    . CARPAL TUNNEL RELEASE Bilateral   . COLONOSCOPY W/ BIOPSIES AND POLYPECTOMY     Hx: of  . JOINT REPLACEMENT    . KNEE ARTHROPLASTY Left   . KNEE ARTHROSCOPY Bilateral "numerous "  . KNEE RECONSTRUCTION Right    Insoll procedure  . SHOULDER ARTHROSCOPY W/ ROTATOR CUFF REPAIR Right   . SYNOVIAL CYST EXCISION     "inside spinal column"  . TONSILLECTOMY AND ADENOIDECTOMY  1998  . TOTAL HIP ARTHROPLASTY Left 06/17/2013   Procedure: LEFT TOTAL HIP ARTHROPLASTY;  Surgeon: Valeria Batman, MD;  Location: [facility] OR;  Service: Orthopedics;  Laterality: Left;  . TOTAL KNEE ARTHROPLASTY Right 10/30/2017   Procedure: RIGHT TOTAL KNEE ARTHROPLASTY;  Surgeon: Valeria Batman, MD;  Location: [facility] OR;  Service: Orthopedics;  Laterality: Right;  . TOTAL KNEE ARTHROPLASTY Right 10/30/2017  . VAGINAL HYSTERECTOMY     total   Social History   Occupational History  . Not on file  Tobacco Use  . Smoking status: Never Smoker  . Smokeless tobacco: Never Used  Vaping Use  . Vaping Use: Never used  Substance and Sexual Activity  . Alcohol use: Yes    Comment: 10/31/2017 "might have 3 drinks/year"  . Drug use: No  . Sexual activity: Never

## 2020-07-06 DIAGNOSIS — M1611 Unilateral primary osteoarthritis, right hip: Secondary | ICD-10-CM | POA: Insufficient documentation

## 2020-07-13 ENCOUNTER — Other Ambulatory Visit: Payer: Self-pay

## 2020-07-14 ENCOUNTER — Other Ambulatory Visit: Payer: Self-pay

## 2020-07-14 ENCOUNTER — Telehealth: Payer: Self-pay | Admitting: Orthopaedic Surgery

## 2020-07-14 ENCOUNTER — Encounter: Payer: Self-pay | Admitting: Surgery

## 2020-07-14 ENCOUNTER — Ambulatory Visit: Payer: BC Managed Care – PPO | Admitting: Surgery

## 2020-07-14 VITALS — BP 134/79 | HR 84 | Ht 63.0 in | Wt 217.4 lb

## 2020-07-14 DIAGNOSIS — M25551 Pain in right hip: Secondary | ICD-10-CM

## 2020-07-14 DIAGNOSIS — M1611 Unilateral primary osteoarthritis, right hip: Secondary | ICD-10-CM

## 2020-07-14 NOTE — Progress Notes (Signed)
64 year old white female history of end-stage DJD right hip chronic pain comes in for preop evaluation.  States that hip symptoms unchanged from previous visit.  She is wanting to proceed with direct anterior right total hip replacement scheduled.  Today history and physical performed.  Patient dates that she does have a history of asthma and uses an inhaler as needed.  Surgical procedure discussed along with potential hospital stay.  Patient had left total hip replacement performed 2014 and did have home health PT postop.  We will arrange PT postop for the surgery as well.  Anticipate out of work at least 8 to 12 weeks postop and we will reevaluate when she is 6 weeks out from surgery.  Patient works as a Warden/ranger.  All questions answered.

## 2020-07-14 NOTE — Telephone Encounter (Signed)
Forms received from [employer]. Sent to [employer]. 

## 2020-07-16 NOTE — Progress Notes (Signed)
Your procedure is scheduled on Wednesday July 28.  Report to [facility] Main Entrance "A" at 10:30 A.M., and check in at the Admitting office.  Call this number if you have problems the morning of surgery: [phone]  Call [phone] if you have any questions prior to your surgery date Monday-Friday 8am-4pm   Remember: Do not eat or drink after midnight the night before your surgery  You may drink clear liquids until 09:30 A.M. the morning of your surgery.   Clear liquids allowed are: Water, Non-Citrus Juices (without pulp), Carbonated Beverages, Clear Tea, Black Coffee Only, and Gatorade   Please complete your PRE-SURGERY ENSURE that was provided to you by 09:30 A.M. the morning of surgery.   Please, if able, drink it in one setting. DO NOT SIP.  Take these medicines the morning of surgery with A SIP OF WATER: BREO ELLIPTA  FLUoxetine (PROZAC)  gabapentin (NEURONTIN) loratadine (CLARITIN)  methocarbamol (ROBAXIN)  traMADol (ULTRAM)  If needed: acetaminophen (TYLENOL)  albuterol (PROVENTIL HFA;VENTOLIN HFA) ---- Please bring all inhalers with you the day of surgery.  fluticasone (FLONASE)  Eye drops   As of today, STOP taking any celecoxib (CELEBREX), diclofenac Sodium (VOLTAREN) gel, Aspirin (unless otherwise instructed by your surgeon), Aleve, Naproxen, Ibuprofen, Motrin, Advil, Goody's, BC's, all herbal medications, fish oil, and all vitamins.    The Morning of Surgery  Do not wear jewelry, make-up or nail polish.  Do not wear lotions, powders, or perfumes, or deodorant  Do not shave 48 hours prior to surgery.    Do not bring valuables to the hospital.  [facility] is not responsible for any belongings or valuables.  If you are a smoker, DO NOT Smoke 24 hours prior to surgery  If you wear a CPAP at night please bring your mask the morning of surgery   Remember that you must have someone to transport you home after your surgery, and remain with you for 24 hours  if you are discharged the same day.   Please bring cases for contacts, glasses, hearing aids, dentures or bridgework because it cannot be worn into surgery.    Leave your suitcase in the car.  After surgery it may be brought to your room.  For patients admitted to the hospital, discharge time will be determined by your treatment team.  Patients discharged the day of surgery will not be allowed to drive home.    Special instructions:   [facility]- Preparing For Surgery  Before surgery, you can play an important role. Because skin is not sterile, your skin needs to be as free of germs as possible. You can reduce the number of germs on your skin by washing with CHG (chlorahexidine gluconate) Soap before surgery.  CHG is an antiseptic cleaner which kills germs and bonds with the skin to continue killing germs even after washing.    Oral Hygiene is also important to reduce your risk of infection.  Remember - BRUSH YOUR TEETH THE MORNING OF SURGERY WITH YOUR REGULAR TOOTHPASTE  Please do not use if you have an allergy to CHG or antibacterial soaps. If your skin becomes reddened/irritated stop using the CHG.  Do not shave (including legs and underarms) for at least 48 hours prior to first CHG shower. It is OK to shave your face.  Please follow these instructions carefully.   1. Shower the NIGHT BEFORE SURGERY and the MORNING OF SURGERY with CHG Soap.   2. If you chose to wash your hair and  body, wash as usual with your normal shampoo and body-wash/soap.  3. Rinse your hair and body thoroughly to remove the shampoo and soap.  4. Apply CHG directly to the skin (ONLY FROM THE NECK DOWN) and wash gently with a scrungie or a clean washcloth.   5. Do not use on open wounds or open sores. Avoid contact with your eyes, ears, mouth and genitals (private parts). Wash Face and genitals (private parts)  with your normal soap.   6. Wash thoroughly, paying special attention to the area where your  surgery will be performed.  7. Thoroughly rinse your body with warm water from the neck down.  8. DO NOT shower/wash with your normal soap after using and rinsing off the CHG Soap.  9. Pat yourself dry with a CLEAN TOWEL.  10. Wear CLEAN PAJAMAS to bed the night before surgery  11. Place CLEAN SHEETS on your bed the night of your first shower and DO NOT SLEEP WITH PETS.  12. Wear comfortable clothes the morning of surgery.     Day of Surgery:  Please shower the morning of surgery with the CHG soap Do not apply any deodorants/lotions. Please wear clean clothes to the hospital/surgery center.   Remember to brush your teeth WITH YOUR REGULAR TOOTHPASTE.   Please read over the following fact sheets that you were given.

## 2020-07-19 ENCOUNTER — Other Ambulatory Visit (HOSPITAL_COMMUNITY)
Admission: RE | Admit: 2020-07-19 | Discharge: 2020-07-19 | Disposition: A | Discharge status: Home / Self Care | Payer: BC Managed Care – PPO | Source: Ambulatory Visit | Attending: Orthopaedic Surgery | Admitting: Orthopaedic Surgery

## 2020-07-19 ENCOUNTER — Other Ambulatory Visit: Payer: Self-pay

## 2020-07-19 ENCOUNTER — Encounter (HOSPITAL_COMMUNITY)
Admission: RE | Admit: 2020-07-19 | Discharge: 2020-07-19 | Disposition: A | Discharge status: Home / Self Care | Payer: BC Managed Care – PPO | Source: Ambulatory Visit | Attending: Orthopaedic Surgery | Admitting: Orthopaedic Surgery

## 2020-07-19 ENCOUNTER — Encounter (HOSPITAL_COMMUNITY): Payer: Self-pay

## 2020-07-19 DIAGNOSIS — Z01810 Encounter for preprocedural cardiovascular examination: Secondary | ICD-10-CM | POA: Diagnosis not present

## 2020-07-19 DIAGNOSIS — Z20822 Contact with and (suspected) exposure to covid-19: Secondary | ICD-10-CM | POA: Diagnosis not present

## 2020-07-19 DIAGNOSIS — R9431 Abnormal electrocardiogram [ECG] [EKG]: Secondary | ICD-10-CM | POA: Insufficient documentation

## 2020-07-19 DIAGNOSIS — I10 Essential (primary) hypertension: Secondary | ICD-10-CM | POA: Diagnosis not present

## 2020-07-19 DIAGNOSIS — Z01812 Encounter for preprocedural laboratory examination: Secondary | ICD-10-CM | POA: Diagnosis present

## 2020-07-19 LAB — CBC
HCT: 41.7 % (ref 36.0–46.0)
Hemoglobin: 13.3 g/dL (ref 12.0–15.0)
MCH: 29.6 pg (ref 26.0–34.0)
MCHC: 31.9 g/dL (ref 30.0–36.0)
MCV: 92.9 fL (ref 80.0–100.0)
Platelets: 429 10*3/uL — ABNORMAL HIGH (ref 150–400)
RBC: 4.49 MIL/uL (ref 3.87–5.11)
RDW: 12.8 % (ref 11.5–15.5)
WBC: 12 10*3/uL — ABNORMAL HIGH (ref 4.0–10.5)
nRBC: 0 % (ref 0.0–0.2)

## 2020-07-19 LAB — URINALYSIS, ROUTINE W REFLEX MICROSCOPIC
Bilirubin Urine: NEGATIVE
Glucose, UA: NEGATIVE mg/dL
Hgb urine dipstick: NEGATIVE
Ketones, ur: NEGATIVE mg/dL
Nitrite: NEGATIVE
Protein, ur: NEGATIVE mg/dL
Specific Gravity, Urine: 1.017 (ref 1.005–1.030)
pH: 5 (ref 5.0–8.0)

## 2020-07-19 LAB — COMPREHENSIVE METABOLIC PANEL
ALT: 21 U/L (ref 0–44)
AST: 23 U/L (ref 15–41)
Albumin: 4.2 g/dL (ref 3.5–5.0)
Alkaline Phosphatase: 76 U/L (ref 38–126)
Anion gap: 10 (ref 5–15)
BUN: 20 mg/dL (ref 8–23)
CO2: 23 mmol/L (ref 22–32)
Calcium: 9.3 mg/dL (ref 8.9–10.3)
Chloride: 105 mmol/L (ref 98–111)
Creatinine, Ser: 0.7 mg/dL (ref 0.44–1.00)
GFR calc Af Amer: >60 mL/min (ref >60)
GFR calc non Af Amer: >60 mL/min (ref >60)
Glucose, Bld: 102 mg/dL — ABNORMAL HIGH (ref 70–99)
Potassium: 4.2 mmol/L (ref 3.5–5.1)
Sodium: 138 mmol/L (ref 135–145)
Total Bilirubin: 0.3 mg/dL (ref 0.3–1.2)
Total Protein: 7.8 g/dL (ref 6.5–8.1)

## 2020-07-19 LAB — SURGICAL PCR SCREEN
MRSA, PCR: POSITIVE — AB
Staphylococcus aureus: POSITIVE — AB

## 2020-07-19 LAB — SARS CORONAVIRUS 2 (TAT 6-24 HRS): SARS Coronavirus 2: NEGATIVE

## 2020-07-19 NOTE — Progress Notes (Signed)
PCP - Dr. Mila Palmer Cardiologist - patient denies  PPM/ICD - n/a Device Orders -  Rep Notified -   Chest x-ray - n/a EKG - 07/19/2020 Stress Test - patient denies ECHO - patient denies Cardiac Cath - patient denies  Sleep Study - n/a CPAP -   Fasting Blood Sugar - n/a Checks Blood Sugar _____ times a day  Blood Thinner Instructions: n/a Aspirin Instructions: n/a  ERAS Protcol - clears until 0930 PRE-SURGERY Ensure or G2- ensure provided  COVID TEST- after PAT appointment   Anesthesia review:  Yes, abnormal EKG  Patient denies shortness of breath, fever, cough and chest pain at PAT appointment   All instructions explained to the patient, with a verbal understanding of the material. Patient agrees to go over the instructions while at home for a better understanding. Patient also instructed to self quarantine after being tested for COVID-19. The opportunity to ask questions was provided.

## 2020-07-19 NOTE — Progress Notes (Addendum)
Danielle Schmitt and Dr. Ophelia Charter notified of patient's abnormal labs  Per order from Dr. Ophelia Charter, a prescription for Septra DS 1 tablet BID for 5 days was called into the patient's preferred pharmacy. Patient called and made aware.

## 2020-07-20 MED ORDER — BUPIVACAINE LIPOSOME 1.3 % IJ SUSP
20.0000 mL | Freq: Once | INTRAMUSCULAR | Status: DC
  Filled 2020-07-20: qty 20

## 2020-07-20 NOTE — Anesthesia Preprocedure Evaluation (Addendum)
Anesthesia Evaluation  Patient identified by MRN, date of birth, ID band Patient awake    Reviewed: Allergy & Precautions, NPO status , Patient's Chart, lab work & pertinent test results  History of Anesthesia Complications (+) POST - OP SPINAL HEADACHE and history of anesthetic complications  Airway Mallampati: I  TM Distance: >3 FB Neck ROM: Full    Dental  (+) Teeth Intact   Pulmonary asthma ,    Pulmonary exam normal        Cardiovascular hypertension, Normal cardiovascular exam     Neuro/Psych  Headaches, Depression    GI/Hepatic negative GI ROS, Neg liver ROS,   Endo/Other  negative endocrine ROS  Renal/GU negative Renal ROS  negative genitourinary   Musculoskeletal  (+) Arthritis ,   Abdominal   Peds  Hematology negative hematology ROS (+)   Anesthesia Other Findings   Reproductive/Obstetrics                          Anesthesia Physical Anesthesia Plan  ASA: II  Anesthesia Plan: Spinal   Post-op Pain Management:    Induction:   PONV Risk Score and Plan: Propofol infusion, Treatment may vary due to age or medical condition, Ondansetron and TIVA  Airway Management Planned: Nasal Cannula and Simple Face Mask  Additional Equipment: None  Intra-op Plan:   Post-operative Plan:   Informed Consent: I have reviewed the patients History and Physical, chart, labs and discussed the procedure including the risks, benefits and alternatives for the proposed anesthesia with the patient or authorized representative who has indicated his/her understanding and acceptance.       Plan Discussed with:   Anesthesia Plan Comments: (PAT note written 07/20/2020 by Shonna Chock, PA-C. )     Anesthesia Quick Evaluation

## 2020-07-20 NOTE — Progress Notes (Signed)
Anesthesia Chart Review:  Case: [id number] Date/Time: 07/21/20 1215   Procedure: RIGHT TOTAL HIP ARTHROPLASTY DIRECT ANTERIOR (Right Hip)   Anesthesia type: Spinal   Pre-op diagnosis: right hip osteoarthritis   [facility]: [facility]OM 06 / [facility]   Surgeons: Eldred Manges, MD      DISCUSSION: Patient is a 64 year old female scheduled for the above procedure.  History includes never smoker, HTN, anemia, migraines, chronic low back pain, THA (left 06/17/13), TKA (right 10/30/17, left 01/20/10), hysterectomy, back and shoulder surgeries.  For anesthesia history, she reports post-operative bronchospasm with hysterectomy and and spinal headache following an epidural. BMI is consistent with obesity.  EKG appears stable dating back to at least 2014. She denied chest pain, SOB, cough, fever at PAT RN visit. Dr. Ophelia Charter prescribed Septra for abnormal UA. 07/19/20 presurgical COVID-19 test negative. Anesthesia team to evaluate on the day of surgery.   VS: BP (!) 115/64   Pulse 88   Temp 37 C (Oral)   Resp 20   Ht 5' 3.5" (1.613 m)   Wt (!) 99 kg   SpO2 98%   BMI 38.04 kg/m   PROVIDERS: Jonathon Bellows, DO is PCP [facility])   LABS: Labs reviewed: Acceptable for surgery. (all labs ordered are listed, but only abnormal results are displayed)  Labs Reviewed  SURGICAL PCR SCREEN - Abnormal; Notable for the following components:      Result Value   MRSA, PCR POSITIVE (*)    Staphylococcus aureus POSITIVE (*)    All other components within normal limits  CBC - Abnormal; Notable for the following components:   WBC 12.0 (*)    Platelets 429 (*)    All other components within normal limits  COMPREHENSIVE METABOLIC PANEL - Abnormal; Notable for the following components:   Glucose, Bld 102 (*)    All other components within normal limits  URINALYSIS, ROUTINE W REFLEX MICROSCOPIC - Abnormal; Notable for the following components:   Leukocytes,Ua SMALL (*)    Bacteria, UA MANY (*)    All other  components within normal limits    EKG: 07/19/20:  Normal sinus rhythm Inferior infarct , age undetermined Abnormal ECG No significant change since last tracing [10/19/17] Confirmed by Olga Millers ([id number]) on 07/19/2020 11:44:16 AM - Also appears stable when compared to 11/22/09 and 04/18/13 tracings scanned under Media tab.   CV: Denied prior stress test, echo, cardiac cath.   Past Medical History:  Diagnosis Date  . Arthritis    "shoulders, knees, fingers, hips" (10/31/2017)  . Asthma   . B12 deficiency anemia   . Chronic lower back pain   . Complication of anesthesia    Hx: of bronchospasm attack during procedure- hysterectomy  . Degenerative joint disease   . Depression   . Environmental allergies   . History of blood transfusion    left knee replacement and hip replacement  . Hypertension   . Incontinence of urine    "due to vaginal hysterectomy; I have pessary in place" (10/31/2017)  . Iron deficiency anemia   . Migraine    "any time weather/pressure changes"   . Pneumonia    "several times; last time was ~ 2014" (10/31/2017)  . Pseudogout of knee, right   . Spinal headache    ;Hx: of headache with Epidural; migraines    Past Surgical History:  Procedure Laterality Date  . BACK SURGERY    . CARPAL TUNNEL RELEASE Bilateral   . COLONOSCOPY W/ BIOPSIES AND POLYPECTOMY  Hx: of  . JOINT REPLACEMENT    . KNEE ARTHROPLASTY Left   . KNEE ARTHROSCOPY Bilateral "numerous "  . KNEE RECONSTRUCTION Right    Insoll procedure  . SHOULDER ARTHROSCOPY W/ ROTATOR CUFF REPAIR Right   . SYNOVIAL CYST EXCISION     "inside spinal column"  . TONSILLECTOMY AND ADENOIDECTOMY  1998  . TOTAL HIP ARTHROPLASTY Left 06/17/2013   Procedure: LEFT TOTAL HIP ARTHROPLASTY;  Surgeon: Valeria Batman, MD;  Location: [facility] OR;  Service: Orthopedics;  Laterality: Left;  . TOTAL KNEE ARTHROPLASTY Right 10/30/2017   Procedure: RIGHT TOTAL KNEE ARTHROPLASTY;  Surgeon: Valeria Batman, MD;   Location: [facility] OR;  Service: Orthopedics;  Laterality: Right;  . TOTAL KNEE ARTHROPLASTY Right 10/30/2017  . VAGINAL HYSTERECTOMY     total    MEDICATIONS: . acetaminophen (TYLENOL) 500 MG tablet  . albuterol (PROVENTIL HFA;VENTOLIN HFA) 108 (90 BASE) MCG/ACT inhaler  . amoxicillin (AMOXIL) 500 MG capsule  . BREO ELLIPTA 100-25 MCG/INH AEPB  . celecoxib (CELEBREX) 200 MG capsule  . cyanocobalamin (,VITAMIN B-12,) 1000 MCG/ML injection  . diclofenac Sodium (VOLTAREN) 1 % GEL  . ergocalciferol (VITAMIN D2) 50000 UNITS capsule  . ferrous sulfate 325 (65 FE) MG tablet  . FLUoxetine (PROZAC) 40 MG capsule  . fluticasone (FLONASE) 50 MCG/ACT nasal spray  . gabapentin (NEURONTIN) 100 MG capsule  . Ketotifen Fumarate (ALAWAY OP)  . lisinopril-hydrochlorothiazide (PRINZIDE,ZESTORETIC) 20-25 MG per tablet  . loratadine (CLARITIN) 10 MG tablet  . methocarbamol (ROBAXIN) 750 MG tablet  . montelukast (SINGULAIR) 10 MG tablet  . Multiple Vitamin (MULTIVITAMIN WITH MINERALS) TABS tablet  . traMADol (ULTRAM) 50 MG tablet   No current facility-administered medications for this encounter.   Melene Muller ON 07/21/2020] bupivacaine liposome (EXPAREL) 1.3 % injection 266 mg     Shonna Chock, PA-C Surgical Short Stay/Anesthesiology [facility] Phone [phone] [facility] Phone [phone] 07/20/2020 12:55 PM

## 2020-07-21 ENCOUNTER — Ambulatory Visit (HOSPITAL_COMMUNITY): Payer: BC Managed Care – PPO | Admitting: Vascular Surgery

## 2020-07-21 ENCOUNTER — Ambulatory Visit (HOSPITAL_COMMUNITY): Payer: BC Managed Care – PPO

## 2020-07-21 ENCOUNTER — Other Ambulatory Visit: Payer: Self-pay

## 2020-07-21 ENCOUNTER — Ambulatory Visit (HOSPITAL_COMMUNITY): Payer: BC Managed Care – PPO | Admitting: Certified Registered"

## 2020-07-21 ENCOUNTER — Encounter (HOSPITAL_COMMUNITY): Payer: Self-pay | Admitting: Orthopaedic Surgery

## 2020-07-21 ENCOUNTER — Inpatient Hospital Stay (HOSPITAL_COMMUNITY)
Admission: AD | Admit: 2020-07-21 | Discharge: 2020-07-25 | DRG: 469 | Disposition: A | Discharge status: Home Health | Payer: BC Managed Care – PPO | Attending: Orthopaedic Surgery | Admitting: Orthopaedic Surgery

## 2020-07-21 ENCOUNTER — Observation Stay (HOSPITAL_COMMUNITY): Payer: BC Managed Care – PPO

## 2020-07-21 ENCOUNTER — Encounter (HOSPITAL_COMMUNITY)
Admission: AD | Disposition: A | Discharge status: Home Health | Payer: Self-pay | Source: Home / Self Care | Attending: Orthopaedic Surgery

## 2020-07-21 DIAGNOSIS — J189 Pneumonia, unspecified organism: Secondary | ICD-10-CM | POA: Diagnosis not present

## 2020-07-21 DIAGNOSIS — M1611 Unilateral primary osteoarthritis, right hip: Secondary | ICD-10-CM

## 2020-07-21 DIAGNOSIS — Z96642 Presence of left artificial hip joint: Secondary | ICD-10-CM | POA: Diagnosis present

## 2020-07-21 DIAGNOSIS — J45909 Unspecified asthma, uncomplicated: Secondary | ICD-10-CM | POA: Diagnosis present

## 2020-07-21 DIAGNOSIS — J9601 Acute respiratory failure with hypoxia: Secondary | ICD-10-CM | POA: Diagnosis not present

## 2020-07-21 DIAGNOSIS — R0600 Dyspnea, unspecified: Secondary | ICD-10-CM

## 2020-07-21 DIAGNOSIS — Z8249 Family history of ischemic heart disease and other diseases of the circulatory system: Secondary | ICD-10-CM

## 2020-07-21 DIAGNOSIS — Z419 Encounter for procedure for purposes other than remedying health state, unspecified: Secondary | ICD-10-CM

## 2020-07-21 DIAGNOSIS — Z09 Encounter for follow-up examination after completed treatment for conditions other than malignant neoplasm: Secondary | ICD-10-CM

## 2020-07-21 DIAGNOSIS — I10 Essential (primary) hypertension: Secondary | ICD-10-CM | POA: Diagnosis present

## 2020-07-21 DIAGNOSIS — J9811 Atelectasis: Secondary | ICD-10-CM | POA: Diagnosis not present

## 2020-07-21 DIAGNOSIS — M25751 Osteophyte, right hip: Secondary | ICD-10-CM | POA: Diagnosis present

## 2020-07-21 DIAGNOSIS — M8568 Other cyst of bone, other site: Secondary | ICD-10-CM | POA: Diagnosis present

## 2020-07-21 DIAGNOSIS — Z8701 Personal history of pneumonia (recurrent): Secondary | ICD-10-CM

## 2020-07-21 DIAGNOSIS — J84112 Idiopathic pulmonary fibrosis: Secondary | ICD-10-CM | POA: Diagnosis present

## 2020-07-21 DIAGNOSIS — R911 Solitary pulmonary nodule: Secondary | ICD-10-CM | POA: Diagnosis present

## 2020-07-21 DIAGNOSIS — Z20822 Contact with and (suspected) exposure to covid-19: Secondary | ICD-10-CM | POA: Diagnosis present

## 2020-07-21 DIAGNOSIS — Z96649 Presence of unspecified artificial hip joint: Secondary | ICD-10-CM

## 2020-07-21 DIAGNOSIS — Z825 Family history of asthma and other chronic lower respiratory diseases: Secondary | ICD-10-CM

## 2020-07-21 DIAGNOSIS — Z9181 History of falling: Secondary | ICD-10-CM

## 2020-07-21 DIAGNOSIS — Z880 Allergy status to penicillin: Secondary | ICD-10-CM

## 2020-07-21 HISTORY — PX: TOTAL HIP ARTHROPLASTY: SHX124

## 2020-07-21 SURGERY — ARTHROPLASTY, HIP, TOTAL, ANTERIOR APPROACH
Anesthesia: Spinal | Site: Hip | Laterality: Right

## 2020-07-21 MED ORDER — EPHEDRINE 5 MG/ML INJ
INTRAVENOUS | Status: AC
  Filled 2020-07-21: qty 10

## 2020-07-21 MED ORDER — LISINOPRIL-HYDROCHLOROTHIAZIDE 20-25 MG PO TABS: 1.0000 | ORAL_TABLET | Freq: Every day | ORAL | Status: DC

## 2020-07-21 MED ORDER — SODIUM CHLORIDE 0.9 % IV SOLN: INTRAVENOUS | Status: DC

## 2020-07-21 MED ORDER — ALBUTEROL SULFATE (2.5 MG/3ML) 0.083% IN NEBU: 3.0000 mL | INHALATION_SOLUTION | Freq: Four times a day (QID) | RESPIRATORY_TRACT | Status: DC | PRN

## 2020-07-21 MED ORDER — ORAL CARE MOUTH RINSE: 15.0000 mL | Freq: Once | OROMUCOSAL | Status: AC

## 2020-07-21 MED ORDER — FENTANYL CITRATE (PF) 100 MCG/2ML IJ SOLN
INTRAMUSCULAR | Status: DC | PRN
  Administered 2020-07-21 (×2): 50 ug via INTRAVENOUS

## 2020-07-21 MED ORDER — METHOCARBAMOL 1000 MG/10ML IJ SOLN
500.0000 mg | Freq: Four times a day (QID) | INTRAVENOUS | Status: DC | PRN
  Filled 2020-07-21: qty 5

## 2020-07-21 MED ORDER — FLUTICASONE FUROATE-VILANTEROL 100-25 MCG/INH IN AEPB
1.0000 | INHALATION_SPRAY | Freq: Every day | RESPIRATORY_TRACT | Status: DC
  Administered 2020-07-23 – 2020-07-25 (×3): 1 via RESPIRATORY_TRACT
  Filled 2020-07-21: qty 28

## 2020-07-21 MED ORDER — BUPIVACAINE LIPOSOME 1.3 % IJ SUSP
INTRAMUSCULAR | Status: DC | PRN
  Administered 2020-07-21: 20 mL

## 2020-07-21 MED ORDER — ACETAMINOPHEN 325 MG PO TABS
325.0000 mg | ORAL_TABLET | Freq: Four times a day (QID) | ORAL | Status: DC | PRN
  Administered 2020-07-21 – 2020-07-25 (×6): 650 mg via ORAL
  Filled 2020-07-21 (×6): qty 2
  Filled 2020-07-21: qty 1

## 2020-07-21 MED ORDER — PHENYLEPHRINE 40 MCG/ML (10ML) SYRINGE FOR IV PUSH (FOR BLOOD PRESSURE SUPPORT)
PREFILLED_SYRINGE | INTRAVENOUS | Status: DC | PRN
  Administered 2020-07-21 (×11): 80 ug via INTRAVENOUS

## 2020-07-21 MED ORDER — MIDAZOLAM HCL 5 MG/5ML IJ SOLN
INTRAMUSCULAR | Status: DC | PRN
  Administered 2020-07-21: 2 mg via INTRAVENOUS

## 2020-07-21 MED ORDER — OXYCODONE HCL 5 MG PO TABS
5.0000 mg | ORAL_TABLET | ORAL | Status: DC | PRN
  Administered 2020-07-21 – 2020-07-22 (×4): 10 mg via ORAL
  Administered 2020-07-22: 5 mg via ORAL
  Administered 2020-07-22 – 2020-07-25 (×9): 10 mg via ORAL
  Administered 2020-07-25: 5 mg via ORAL
  Filled 2020-07-21 (×4): qty 2
  Filled 2020-07-21: qty 1
  Filled 2020-07-21 (×10): qty 2

## 2020-07-21 MED ORDER — EPHEDRINE SULFATE-NACL 50-0.9 MG/10ML-% IV SOSY
PREFILLED_SYRINGE | INTRAVENOUS | Status: DC | PRN
  Administered 2020-07-21: 7.5 mg via INTRAVENOUS
  Administered 2020-07-21: 5 mg via INTRAVENOUS

## 2020-07-21 MED ORDER — FLUOXETINE HCL 20 MG PO CAPS
40.0000 mg | ORAL_CAPSULE | Freq: Every day | ORAL | Status: DC
  Administered 2020-07-22 – 2020-07-25 (×4): 40 mg via ORAL
  Filled 2020-07-21 (×4): qty 2

## 2020-07-21 MED ORDER — CYANOCOBALAMIN 1000 MCG/ML IJ SOLN: 1000.0000 ug | INTRAMUSCULAR | Status: DC

## 2020-07-21 MED ORDER — MONTELUKAST SODIUM 10 MG PO TABS
10.0000 mg | ORAL_TABLET | Freq: Every day | ORAL | Status: DC
  Administered 2020-07-22 – 2020-07-25 (×4): 10 mg via ORAL
  Filled 2020-07-21 (×4): qty 1

## 2020-07-21 MED ORDER — VITAMIN D (ERGOCALCIFEROL) 1.25 MG (50000 UNIT) PO CAPS: 50000.0000 [IU] | ORAL_CAPSULE | ORAL | Status: DC

## 2020-07-21 MED ORDER — DEXAMETHASONE SODIUM PHOSPHATE 10 MG/ML IJ SOLN
INTRAMUSCULAR | Status: DC | PRN
  Administered 2020-07-21: 10 mg via INTRAVENOUS

## 2020-07-21 MED ORDER — BUPIVACAINE IN DEXTROSE 0.75-8.25 % IT SOLN
INTRATHECAL | Status: DC | PRN
  Administered 2020-07-21: 1.6 mL via INTRATHECAL

## 2020-07-21 MED ORDER — LORATADINE 10 MG PO TABS
10.0000 mg | ORAL_TABLET | Freq: Every day | ORAL | Status: DC
  Administered 2020-07-22 – 2020-07-25 (×4): 10 mg via ORAL
  Filled 2020-07-21 (×4): qty 1

## 2020-07-21 MED ORDER — PHENYLEPHRINE HCL-NACL 10-0.9 MG/250ML-% IV SOLN
INTRAVENOUS | Status: DC | PRN
  Administered 2020-07-21: 30 ug/min via INTRAVENOUS

## 2020-07-21 MED ORDER — HYDROCHLOROTHIAZIDE 25 MG PO TABS
25.0000 mg | ORAL_TABLET | Freq: Every day | ORAL | Status: DC
  Administered 2020-07-21 – 2020-07-25 (×5): 25 mg via ORAL
  Filled 2020-07-21 (×5): qty 1

## 2020-07-21 MED ORDER — METHOCARBAMOL 500 MG PO TABS
500.0000 mg | ORAL_TABLET | Freq: Four times a day (QID) | ORAL | Status: DC | PRN
  Administered 2020-07-21 – 2020-07-22 (×4): 500 mg via ORAL
  Filled 2020-07-21 (×4): qty 1

## 2020-07-21 MED ORDER — METOCLOPRAMIDE HCL 5 MG PO TABS: 5.0000 mg | ORAL_TABLET | Freq: Three times a day (TID) | ORAL | Status: DC | PRN

## 2020-07-21 MED ORDER — VANCOMYCIN HCL IN DEXTROSE 1-5 GM/200ML-% IV SOLN
1000.0000 mg | INTRAVENOUS | Status: AC
  Administered 2020-07-21: 1000 mg via INTRAVENOUS
  Filled 2020-07-21: qty 200

## 2020-07-21 MED ORDER — POLYETHYLENE GLYCOL 3350 17 G PO PACK: 17.0000 g | PACK | Freq: Every day | ORAL | Status: DC | PRN

## 2020-07-21 MED ORDER — ASPIRIN EC 325 MG PO TBEC
325.0000 mg | DELAYED_RELEASE_TABLET | Freq: Every day | ORAL | Status: DC
  Administered 2020-07-22 – 2020-07-25 (×4): 325 mg via ORAL
  Filled 2020-07-21 (×4): qty 1

## 2020-07-21 MED ORDER — LISINOPRIL 20 MG PO TABS
20.0000 mg | ORAL_TABLET | Freq: Every day | ORAL | Status: DC
  Administered 2020-07-21 – 2020-07-25 (×5): 20 mg via ORAL
  Filled 2020-07-21 (×5): qty 1

## 2020-07-21 MED ORDER — METOCLOPRAMIDE HCL 5 MG/ML IJ SOLN: 5.0000 mg | Freq: Three times a day (TID) | INTRAMUSCULAR | Status: DC | PRN

## 2020-07-21 MED ORDER — ONDANSETRON HCL 4 MG/2ML IJ SOLN
INTRAMUSCULAR | Status: AC
  Filled 2020-07-21: qty 2

## 2020-07-21 MED ORDER — PHENYLEPHRINE 40 MCG/ML (10ML) SYRINGE FOR IV PUSH (FOR BLOOD PRESSURE SUPPORT)
PREFILLED_SYRINGE | INTRAVENOUS | Status: AC
  Filled 2020-07-21: qty 20

## 2020-07-21 MED ORDER — FLUTICASONE PROPIONATE 50 MCG/ACT NA SUSP
1.0000 | Freq: Two times a day (BID) | NASAL | Status: DC | PRN
  Filled 2020-07-21: qty 16

## 2020-07-21 MED ORDER — ONDANSETRON HCL 4 MG PO TABS: 4.0000 mg | ORAL_TABLET | Freq: Four times a day (QID) | ORAL | Status: DC | PRN

## 2020-07-21 MED ORDER — PROPOFOL 500 MG/50ML IV EMUL
INTRAVENOUS | Status: DC | PRN
  Administered 2020-07-21: 75 ug/kg/min via INTRAVENOUS
  Administered 2020-07-21: 100 ug/kg/min via INTRAVENOUS

## 2020-07-21 MED ORDER — MENTHOL 3 MG MT LOZG: 1.0000 | LOZENGE | OROMUCOSAL | Status: DC | PRN

## 2020-07-21 MED ORDER — DEXAMETHASONE SODIUM PHOSPHATE 10 MG/ML IJ SOLN
INTRAMUSCULAR | Status: AC
  Filled 2020-07-21: qty 1

## 2020-07-21 MED ORDER — FERROUS SULFATE 325 (65 FE) MG PO TABS
325.0000 mg | ORAL_TABLET | Freq: Every day | ORAL | Status: DC
  Administered 2020-07-22 – 2020-07-25 (×4): 325 mg via ORAL
  Filled 2020-07-21 (×4): qty 1

## 2020-07-21 MED ORDER — HYDROMORPHONE HCL 1 MG/ML IJ SOLN
0.5000 mg | INTRAMUSCULAR | Status: DC | PRN
  Administered 2020-07-21 – 2020-07-22 (×2): 0.5 mg via INTRAVENOUS
  Filled 2020-07-21 (×2): qty 1

## 2020-07-21 MED ORDER — ONDANSETRON HCL 4 MG/2ML IJ SOLN
INTRAMUSCULAR | Status: DC | PRN
  Administered 2020-07-21: 4 mg via INTRAVENOUS

## 2020-07-21 MED ORDER — PHENOL 1.4 % MT LIQD: 1.0000 | OROMUCOSAL | Status: DC | PRN

## 2020-07-21 MED ORDER — LACTATED RINGERS IV SOLN: INTRAVENOUS | Status: DC

## 2020-07-21 MED ORDER — BUPIVACAINE IN DEXTROSE 0.75-8.25 % IT SOLN
INTRATHECAL | Status: AC
  Filled 2020-07-21: qty 4

## 2020-07-21 MED ORDER — TRANEXAMIC ACID-NACL 1000-0.7 MG/100ML-% IV SOLN
INTRAVENOUS | Status: DC | PRN
  Administered 2020-07-21: 1000 mg via INTRAVENOUS

## 2020-07-21 MED ORDER — TRANEXAMIC ACID-NACL 1000-0.7 MG/100ML-% IV SOLN
INTRAVENOUS | Status: AC
  Filled 2020-07-21: qty 100

## 2020-07-21 MED ORDER — ACETAMINOPHEN 325 MG PO TABS
ORAL_TABLET | ORAL | Status: AC
  Filled 2020-07-21: qty 2

## 2020-07-21 MED ORDER — DOCUSATE SODIUM 100 MG PO CAPS
100.0000 mg | ORAL_CAPSULE | Freq: Two times a day (BID) | ORAL | Status: DC
  Administered 2020-07-21 – 2020-07-25 (×8): 100 mg via ORAL
  Filled 2020-07-21 (×8): qty 1

## 2020-07-21 MED ORDER — CHLORHEXIDINE GLUCONATE 0.12 % MT SOLN
15.0000 mL | Freq: Once | OROMUCOSAL | Status: AC
  Administered 2020-07-21: 15 mL via OROMUCOSAL
  Filled 2020-07-21: qty 15

## 2020-07-21 MED ORDER — PHENYLEPHRINE 40 MCG/ML (10ML) SYRINGE FOR IV PUSH (FOR BLOOD PRESSURE SUPPORT)
PREFILLED_SYRINGE | INTRAVENOUS | Status: AC
  Filled 2020-07-21: qty 10

## 2020-07-21 MED ORDER — MIDAZOLAM HCL 2 MG/2ML IJ SOLN
INTRAMUSCULAR | Status: AC
  Filled 2020-07-21: qty 2

## 2020-07-21 MED ORDER — FENTANYL CITRATE (PF) 100 MCG/2ML IJ SOLN
INTRAMUSCULAR | Status: AC
  Filled 2020-07-21: qty 2

## 2020-07-21 MED ORDER — GABAPENTIN 100 MG PO CAPS
200.0000 mg | ORAL_CAPSULE | Freq: Three times a day (TID) | ORAL | Status: DC
  Administered 2020-07-21 – 2020-07-25 (×12): 200 mg via ORAL
  Filled 2020-07-21 (×12): qty 2

## 2020-07-21 MED ORDER — BUPIVACAINE HCL (PF) 0.5 % IJ SOLN
INTRAMUSCULAR | Status: AC
  Filled 2020-07-21: qty 30

## 2020-07-21 MED ORDER — KETOTIFEN FUMARATE 0.025 % OP SOLN: 1.0000 [drp] | Freq: Every day | OPHTHALMIC | Status: DC | PRN

## 2020-07-21 MED ORDER — ONDANSETRON HCL 4 MG/2ML IJ SOLN: 4.0000 mg | Freq: Four times a day (QID) | INTRAMUSCULAR | Status: DC | PRN

## 2020-07-21 SURGICAL SUPPLY — 47 items
BLADE SAW SGTL 18X1.27X75 (BLADE) ×2 IMPLANT
BLADE SAW SGTL 18X1.27X75MM (BLADE) ×1
CELLS DAT CNTRL 66122 CELL SVR (MISCELLANEOUS) ×1 IMPLANT
COVER SURGICAL LIGHT HANDLE (MISCELLANEOUS) ×3 IMPLANT
DRAPE C-ARM 42X72 X-RAY (DRAPES) ×3 IMPLANT
DRAPE IMP U-DRAPE 54X76 (DRAPES) ×3 IMPLANT
DRAPE STERI IOBAN 125X83 (DRAPES) ×3 IMPLANT
DRAPE U-SHAPE 47X51 STRL (DRAPES) ×9 IMPLANT
DRSG MEPILEX BORDER 4X12 (GAUZE/BANDAGES/DRESSINGS) ×2 IMPLANT
DURAPREP 26ML APPLICATOR (WOUND CARE) ×3 IMPLANT
ELECT BLADE 4.0 EZ CLEAN MEGAD (MISCELLANEOUS)
ELECT CAUTERY BLADE 6.4 (BLADE) ×3 IMPLANT
ELECT REM PT RETURN 9FT ADLT (ELECTROSURGICAL) ×3
ELECTRODE BLDE 4.0 EZ CLN MEGD (MISCELLANEOUS) IMPLANT
ELECTRODE REM PT RTRN 9FT ADLT (ELECTROSURGICAL) ×1 IMPLANT
ELIMINATOR HOLE APEX DEPUY (Hips) ×2 IMPLANT
FACESHIELD WRAPAROUND (MASK) ×6 IMPLANT
FACESHIELD WRAPAROUND OR TEAM (MASK) ×2 IMPLANT
GLOVE BIOGEL PI IND STRL 8 (GLOVE) ×2 IMPLANT
GLOVE BIOGEL PI INDICATOR 8 (GLOVE) ×4
GLOVE ORTHO TXT STRL SZ7.5 (GLOVE) ×6 IMPLANT
GOWN STRL REUS W/ TWL LRG LVL3 (GOWN DISPOSABLE) ×1 IMPLANT
GOWN STRL REUS W/ TWL XL LVL3 (GOWN DISPOSABLE) ×1 IMPLANT
GOWN STRL REUS W/TWL 2XL LVL3 (GOWN DISPOSABLE) ×3 IMPLANT
GOWN STRL REUS W/TWL LRG LVL3 (GOWN DISPOSABLE) ×3
GOWN STRL REUS W/TWL XL LVL3 (GOWN DISPOSABLE) ×3
HEAD M SROM 36MM PLUS 1.5 (Hips) IMPLANT
KIT BASIN OR (CUSTOM PROCEDURE TRAY) ×3 IMPLANT
KIT TURNOVER KIT B (KITS) ×3 IMPLANT
LINER NEUTRAL 52X36MM PLUS 4 (Liner) ×2 IMPLANT
MANIFOLD NEPTUNE II (INSTRUMENTS) ×3 IMPLANT
NS IRRIG 1000ML POUR BTL (IV SOLUTION) ×3 IMPLANT
PACK TOTAL JOINT (CUSTOM PROCEDURE TRAY) ×3 IMPLANT
PIN SECTOR W/GRIP ACE CUP 52MM (Hips) ×2 IMPLANT
RETRACTOR WND ALEXIS 18 MED (MISCELLANEOUS) ×1 IMPLANT
RTRCTR WOUND ALEXIS 18CM MED (MISCELLANEOUS) ×3
SCREW PINN CAN 6.5X20 (Screw) ×2 IMPLANT
SROM M HEAD 36MM PLUS 1.5 (Hips) ×3 IMPLANT
STEM FEM ACTIS STD SZ4 (Stem) ×2 IMPLANT
SUT VIC AB 0 CT1 27 (SUTURE) ×3
SUT VIC AB 0 CT1 27XBRD ANBCTR (SUTURE) ×1 IMPLANT
SUT VIC AB 2-0 CT1 27 (SUTURE) ×3
SUT VIC AB 2-0 CT1 TAPERPNT 27 (SUTURE) ×1 IMPLANT
SUT VICRYL 4-0 PS2 18IN ABS (SUTURE) ×3 IMPLANT
SUT VLOC 180 0 24IN GS25 (SUTURE) ×3 IMPLANT
TOWEL GREEN STERILE (TOWEL DISPOSABLE) ×6 IMPLANT
TOWEL GREEN STERILE FF (TOWEL DISPOSABLE) ×3 IMPLANT

## 2020-07-21 NOTE — H&P (Signed)
TOTAL HIP ADMISSION H&P  Patient is admitted for right total hip arthroplasty.  Subjective:  Chief Complaint: right hip pain  HPI: Danielle Schmitt, 64 y.o. female, has a history of pain and functional disability in the right hip(s) due to arthritis and patient has failed non-surgical conservative treatments for greater than 12 weeks to include NSAID's and/or analgesics, corticosteriod injections, use of assistive devices and activity modification.  Onset of symptoms was gradual starting 10 years ago with gradually worsening course since that time.The patient noted no past surgery on the right hip(s).  Patient currently rates pain in the right hip at 10 out of 10 with activity. Patient has night pain, worsening of pain with activity and weight bearing, trendelenberg gait, pain that interfers with activities of daily living and pain with passive range of motion. Patient has evidence of subchondral cysts, subchondral sclerosis, periarticular osteophytes and joint space narrowing by imaging studies. This condition presents safety issues increasing the risk of falls..  There is no current active infection.  Patient Active Problem List   Diagnosis Date Noted  . Unilateral primary osteoarthritis, right hip 07/06/2020  . Bilateral primary osteoarthritis of hip 06/30/2020  . Pain in right hip 06/09/2020  . Primary osteoarthritis of right knee 10/30/2017  . S/P TKR (total knee replacement) using cement, right 10/30/2017  . Postoperative anemia due to acute blood loss 06/19/2013  . Osteoarthritis of left hip 06/17/2013  . Morbid obesity (HCC) 06/17/2013  . Asthma 06/17/2013   Past Medical History:  Diagnosis Date  . Arthritis    "shoulders, knees, fingers, hips" (10/31/2017)  . Asthma   . B12 deficiency anemia   . Chronic lower back pain   . Complication of anesthesia    Hx: of bronchospasm attack during procedure- hysterectomy  . Degenerative joint disease   . Depression   . Environmental  allergies   . History of blood transfusion    left knee replacement and hip replacement  . Hypertension   . Incontinence of urine    "due to vaginal hysterectomy; I have pessary in place" (10/31/2017)  . Iron deficiency anemia   . Migraine    "any time weather/pressure changes"   . Pneumonia    "several times; last time was ~ 2014" (10/31/2017)  . Pseudogout of knee, right   . Spinal headache    ;Hx: of headache with Epidural; migraines    Past Surgical History:  Procedure Laterality Date  . BACK SURGERY    . CARPAL TUNNEL RELEASE Bilateral   . COLONOSCOPY W/ BIOPSIES AND POLYPECTOMY     Hx: of  . JOINT REPLACEMENT    . KNEE ARTHROPLASTY Left   . KNEE ARTHROSCOPY Bilateral "numerous "  . KNEE RECONSTRUCTION Right    Insoll procedure  . SHOULDER ARTHROSCOPY W/ ROTATOR CUFF REPAIR Right   . SYNOVIAL CYST EXCISION     "inside spinal column"  . TONSILLECTOMY AND ADENOIDECTOMY  1998  . TOTAL HIP ARTHROPLASTY Left 06/17/2013   Procedure: LEFT TOTAL HIP ARTHROPLASTY;  Surgeon: Valeria Batman, MD;  Location: [facility] OR;  Service: Orthopedics;  Laterality: Left;  . TOTAL KNEE ARTHROPLASTY Right 10/30/2017   Procedure: RIGHT TOTAL KNEE ARTHROPLASTY;  Surgeon: Valeria Batman, MD;  Location: [facility] OR;  Service: Orthopedics;  Laterality: Right;  . TOTAL KNEE ARTHROPLASTY Right 10/30/2017  . VAGINAL HYSTERECTOMY     total    Current Facility-Administered Medications  Medication Dose Route Frequency Provider Last Rate Last Admin  . bupivacaine liposome (EXPAREL)  1.3 % injection 266 mg  20 mL Infiltration Once Eldred Manges, MD      . lactated ringers infusion   Intravenous Continuous Ellender, Catheryn Bacon, MD 10 mL/hr at 07/21/20 1131 New Bag at 07/21/20 1131  . vancomycin (VANCOCIN) IVPB 1000 mg/200 mL premix  1,000 mg Intravenous On Call to OR Naida Sleight, PA-C 200 mL/hr at 07/21/20 1129 1,000 mg at 07/21/20 1129   Allergies  Allergen Reactions  . Augmentin [Amoxicillin-Pot Clavulanate]  Rash and Other (See Comments)    Allergic to the Pot-clavulante in the augmentin    Social History   Tobacco Use  . Smoking status: Never Smoker  . Smokeless tobacco: Never Used  Substance Use Topics  . Alcohol use: Yes    Comment: 10/31/2017 "might have 3 drinks/year"    Family History  Problem Relation Age of Onset  . Cancer - Lung Mother   . Hypertension Mother   . Hypertension Father   . Kidney failure Father   . Hypertension Brother      Review of Systems  Constitutional: Positive for activity change.  HENT: Negative.   Respiratory: Negative.   Gastrointestinal: Negative.   Genitourinary: Negative.   Musculoskeletal: Positive for gait problem.  Psychiatric/Behavioral: Negative.     Objective:  Physical Exam HENT:     Head: Normocephalic and atraumatic.     Mouth/Throat:     Mouth: Mucous membranes are dry.  Cardiovascular:     Rate and Rhythm: Regular rhythm.  Pulmonary:     Effort: Pulmonary effort is normal. No respiratory distress.     Breath sounds: Normal breath sounds.  Abdominal:     General: Bowel sounds are normal.  Musculoskeletal:        General: Tenderness present.     Cervical back: Normal range of motion.  Skin:    General: Skin is warm and dry.  Neurological:     General: No focal deficit present.     Mental Status: She is alert and oriented to person, place, and time.  Psychiatric:        Mood and Affect: Mood normal.     Vital signs in last 24 hours: Temp:  [98.4 F (36.9 C)] 98.4 F (36.9 C) (07/28 1037) Pulse Rate:  [100] 100 (07/28 1037) Resp:  [18] 18 (07/28 1037) BP: (155)/(76) 155/76 (07/28 1037) SpO2:  [96 %] 96 % (07/28 1037) Weight:  [98.9 kg] 98.9 kg (07/28 1037)  Labs:   Estimated body mass index is 38.01 kg/m as calculated from the following:   Height as of this encounter: 5' 3.5" (1.613 m).   Weight as of this encounter: 98.9 kg.   Imaging Review Plain radiographs demonstrate moderate degenerative joint  disease of the right hip(s). The bone quality appears to be good for age and reported activity level.      Assessment/Plan:  End stage arthritis, right hip(s)  The patient history, physical examination, clinical judgement of the provider and imaging studies are consistent with end stage degenerative joint disease of the right hip(s) and total hip arthroplasty is deemed medically necessary. The treatment options including medical management, injection therapy, arthroscopy and arthroplasty were discussed at length. The risks and benefits of total hip arthroplasty were presented and reviewed. The risks due to aseptic loosening, infection, stiffness, dislocation/subluxation,  thromboembolic complications and other imponderables were discussed.  The patient acknowledged the explanation, agreed to proceed with the plan and consent was signed. Patient is being admitted for inpatient treatment for  surgery, pain control, PT, OT, prophylactic antibiotics, VTE prophylaxis, progressive ambulation and ADL's and discharge planning.The patient is planning to be discharged home with home health services

## 2020-07-21 NOTE — Interval H&P Note (Signed)
History and Physical Interval Note:  07/21/2020 12:41 PM  Danielle Schmitt  has presented today for surgery, with the diagnosis of right hip osteoarthritis.  The various methods of treatment have been discussed with the patient and family. After consideration of risks, benefits and other options for treatment, the patient has consented to  Procedure(s): RIGHT TOTAL HIP ARTHROPLASTY DIRECT ANTERIOR (Right) as a surgical intervention.  The patient's history has been reviewed, patient examined, no change in status, stable for surgery.  I have reviewed the patient's chart and labs.  Questions were answered to the patient's satisfaction.     Eldred Manges

## 2020-07-21 NOTE — Anesthesia Postprocedure Evaluation (Signed)
Anesthesia Post Note  Patient: Danielle Schmitt  Procedure(s) Performed: RIGHT TOTAL HIP ARTHROPLASTY DIRECT ANTERIOR (Right Hip)     Patient location during evaluation: PACU Anesthesia Type: Spinal Level of consciousness: oriented and awake and alert Pain management: pain level controlled Vital Signs Assessment: post-procedure vital signs reviewed and stable Respiratory status: spontaneous breathing, respiratory function stable and nonlabored ventilation Cardiovascular status: blood pressure returned to baseline and stable Postop Assessment: no headache, no backache, no apparent nausea or vomiting and spinal receding Anesthetic complications: no   No complications documented.  Last Vitals:  Vitals:   07/21/20 1530 07/21/20 1545  BP: (!) 109/52 (!) 116/63  Pulse: 78 76  Resp: 14 18  Temp:    SpO2: 95% 97%    Last Pain:  Vitals:   07/21/20 1530  TempSrc:   PainSc: 0-No pain                 Lucretia Kern

## 2020-07-21 NOTE — Transfer of Care (Signed)
Immediate Anesthesia Transfer of Care Note  Patient: Danielle Schmitt  Procedure(s) Performed: RIGHT TOTAL HIP ARTHROPLASTY DIRECT ANTERIOR (Right Hip)  Patient Location: PACU  Anesthesia Type:MAC combined with regional for post-op pain  Level of Consciousness: drowsy and patient cooperative  Airway & Oxygen Therapy: Patient Spontanous Breathing  Post-op Assessment: Report given to RN and Post -op Vital signs reviewed and stable  Post vital signs: Reviewed and stable  Last Vitals:  Vitals Value Taken Time  BP 87/47 07/21/20 1505  Temp    Pulse 80 07/21/20 1506  Resp 16 07/21/20 1506  SpO2 96 % 07/21/20 1506  Vitals shown include unvalidated device data.  Last Pain:  Vitals:   07/21/20 1037  TempSrc: Oral         Complications: No complications documented.

## 2020-07-21 NOTE — Plan of Care (Signed)

## 2020-07-21 NOTE — Op Note (Signed)
Preop diagnosis: Right hip primary osteoarthritis  Postop diagnosis: Same  Procedure: Right total hip arthroplasty, direct anterior approach  Surgeon: Annell Greening, MD  Assistant: Zonia Kief, PA-C medically necessary and present for the entire procedure  Anesthesia spinal anesthesia plus Marcaine 10 cc Exparel 10 cc injected at end of case.  Implants:Depuy pin sector 52 mm X36 +4 polyethylene neutral liner.  20 mm single dome screw.  Standard size 4Actis femoral stem.  +1.5 metal ball.  Complications: None  EBL: 200cc approximately see anesthetic record  Procedure: After induction of spinal anesthesia placement of Foley catheter Hana boots patient was placed on the Hana table with careful padding and positioning.  C-arm was brought in visualization of the right hip which had severe osteoarthritis bone-on-bone changes marginal osteophytes and subchondral sclerosis.  Patient had previous total of arthroplasty opposite side.  Opposite hip had a 52 cup which fit nicely.  Once both hips were visualized 1015 drapes have been applied DuraPrep usual blue split sticky U drapes sterile skin marker and a large shower curtain Betadine Steri-Drape.  Timeout procedure completed vancomycin and TXA were given by anesthesia staff.  Direct anterior approach started 1 fingerbreadth lateral 1 inferior to the ASIS extending obliquely to the trochanter.  Fascia was identified coming through thick adipose layer nicked extended in line with the skin incision and dull cobra was placed over the top of the capsule.  Transverse vessels were identified coagulated with the Bovie.  Capsule was opened there was a mixture of synovial fluid and some blood was present in the joint.  Neck was cut 1 fingerbreadth above the lesser trochanter under fluoroscopic visualization head was removed with a corkscrew.  90 retractor placed anteriorly over the acetabulum progression up to 51 one 452 cup dialed in under C arm for positioning  impacted apex hole eliminator was placed followed by single dome screw superior lateral 20 mm.  Screw was tight cup was impacted.  Hydraulic clamp was applied to the femur leg was then externally rotated to 120 degrees taken down and under and then preparation of the femur.  A large trochanteric clamp was placed through the gluteus medius tendon and behind the trochanter to provide exposure.  We progressed to a #3 size which look like it fit fairly well under x-ray but appeared to size 4 with possibly able still fit tighter.  We progressed to 4 there was good stability with +1.5 neck length restoration of identical leg lengths measured with a line across the lesser trochanters and the ischial tuberosity.  Permit for stem was impacted down flush with the calcar which did not need to be reamed.  +1.5 gave good stability.  External rotation 90 degrees and extension halfway to the floor without subluxation.  Only trace shuck.  Bleeders were coagulated again.  Standard V-Loc layered closure 2 oh and #1 in subcutaneous tissue skin staple closure postop dressing and transferred to care room.

## 2020-07-21 NOTE — Anesthesia Procedure Notes (Addendum)
Spinal  Patient location during procedure: OR Start time: 07/21/2020 12:55 PM End time: 07/21/2020 1:00 PM Staffing Performed: resident/CRNA  Resident/CRNA: Marny Lowenstein, CRNA Preanesthetic Checklist Completed: patient identified, IV checked, site marked, risks and benefits discussed, surgical consent, monitors and equipment checked, pre-op evaluation and timeout performed Spinal Block Patient position: sitting Prep: DuraPrep Patient monitoring: heart rate, continuous pulse ox and blood pressure Approach: midline Location: L3-4 Injection technique: single-shot Needle Needle type: Pencan  Needle gauge: 24 G Needle length: 10 cm Assessment Sensory level: T6

## 2020-07-22 ENCOUNTER — Encounter (HOSPITAL_COMMUNITY): Payer: Self-pay | Admitting: Orthopaedic Surgery

## 2020-07-22 LAB — BASIC METABOLIC PANEL
Anion gap: 10 (ref 5–15)
BUN: 8 mg/dL (ref 8–23)
CO2: 24 mmol/L (ref 22–32)
Calcium: 8.6 mg/dL — ABNORMAL LOW (ref 8.9–10.3)
Chloride: 102 mmol/L (ref 98–111)
Creatinine, Ser: 0.7 mg/dL (ref 0.44–1.00)
GFR calc Af Amer: >60 mL/min (ref >60)
GFR calc non Af Amer: >60 mL/min (ref >60)
Glucose, Bld: 130 mg/dL — ABNORMAL HIGH (ref 70–99)
Potassium: 4.4 mmol/L (ref 3.5–5.1)
Sodium: 136 mmol/L (ref 135–145)

## 2020-07-22 LAB — CBC
HCT: 29.6 % — ABNORMAL LOW (ref 36.0–46.0)
Hemoglobin: 9.9 g/dL — ABNORMAL LOW (ref 12.0–15.0)
MCH: 30.3 pg (ref 26.0–34.0)
MCHC: 33.4 g/dL (ref 30.0–36.0)
MCV: 90.5 fL (ref 80.0–100.0)
Platelets: 336 10*3/uL (ref 150–400)
RBC: 3.27 MIL/uL — ABNORMAL LOW (ref 3.87–5.11)
RDW: 12.7 % (ref 11.5–15.5)
WBC: 12.7 10*3/uL — ABNORMAL HIGH (ref 4.0–10.5)
nRBC: 0 % (ref 0.0–0.2)

## 2020-07-22 LAB — GLUCOSE, CAPILLARY: Glucose-Capillary: 110 mg/dL — ABNORMAL HIGH (ref 70–99)

## 2020-07-22 MED ORDER — OXYCODONE-ACETAMINOPHEN 5-325 MG PO TABS: 1.0000 | ORAL_TABLET | Freq: Four times a day (QID) | ORAL | 0 refills | Status: AC | PRN

## 2020-07-22 MED ORDER — ASPIRIN 325 MG PO TABS: 325.0000 mg | ORAL_TABLET | Freq: Every day | ORAL | Status: AC

## 2020-07-22 NOTE — Plan of Care (Signed)
  Problem: Pain Managment: Goal: General experience of comfort will improve Outcome: Progressing   Problem: Safety: Goal: Ability to remain free from injury will improve Outcome: Progressing   Problem: Skin Integrity: Goal: Risk for impaired skin integrity will decrease Outcome: Progressing   

## 2020-07-22 NOTE — Progress Notes (Signed)
   Subjective: 1 Day Post-Op Procedure(s) (LRB): RIGHT TOTAL HIP ARTHROPLASTY DIRECT ANTERIOR (Right) Patient reports pain as moderate.    Objective: Vital signs in last 24 hours: Temp:  [97.7 F (36.5 C)-99.3 F (37.4 C)] 99.1 F (37.3 C) (07/29 0302) Pulse Rate:  [74-100] 76 (07/29 0302) Resp:  [14-21] 15 (07/29 0302) BP: (87-155)/(44-76) 107/52 (07/29 0302) SpO2:  [93 %-98 %] 97 % (07/29 0302) Weight:  [98.9 kg] 98.9 kg (07/28 1037)  Intake/Output from previous day: 07/28 0701 - 07/29 0700 In: 2200 [I.V.:2100; IV Piggyback:100] Out: 3700 [Urine:3400; Blood:300] Intake/Output this shift: No intake/output data recorded.  Recent Labs    07/19/20 1044 07/22/20 0325  HGB 13.3 9.9*   Recent Labs    07/19/20 1044 07/22/20 0325  WBC 12.0* 12.7*  RBC 4.49 3.27*  HCT 41.7 29.6*  PLT 429* 336   Recent Labs    07/19/20 1044 07/22/20 0325  NA 138 136  K 4.2 4.4  CL 105 102  CO2 23 24  BUN 20 8  CREATININE 0.70 0.70  GLUCOSE 102* 130*  CALCIUM 9.3 8.6*   No results for input(s): LABPT, INR in the last 72 hours.  Neurologically intact DG C-Arm 1-60 Min  Result Date: 07/21/2020 CLINICAL DATA:  Elective surgery.  Right hip replacement. EXAM: OPERATIVE RIGHT HIP (WITH PELVIS IF PERFORMED) TECHNIQUE: Fluoroscopic spot image(s) were submitted for interpretation post-operatively. COMPARISON:  None. FINDINGS: Two fluoroscopic spot views obtained in the operating room. Right hip arthroplasty in expected alignment. Total fluoroscopy time 20 seconds. Total dose 2.64 mGy. IMPRESSION: Fluoroscopic spot views after right hip arthroplasty. Electronically Signed   By: Narda Rutherford M.D.   On: 07/21/2020 16:19   DG Hip Port Unilat With Pelvis 1V Right  Result Date: 07/21/2020 CLINICAL DATA:  Postop right hip arthroplasty. EXAM: DG HIP (WITH OR WITHOUT PELVIS) 1V PORT RIGHT COMPARISON:  None. FINDINGS: Right hip arthroplasty in expected alignment. No periprosthetic lucency or  fracture. Recent postsurgical change includes air and edema in the soft tissues with lateral skin staples. Left hip arthroplasty is also noted. IMPRESSION: Right hip arthroplasty without immediate postoperative complication. Electronically Signed   By: Narda Rutherford M.D.   On: 07/21/2020 16:18   DG HIP OPERATIVE UNILAT W OR W/O PELVIS RIGHT  Result Date: 07/21/2020 CLINICAL DATA:  Elective surgery.  Right hip replacement. EXAM: OPERATIVE RIGHT HIP (WITH PELVIS IF PERFORMED) TECHNIQUE: Fluoroscopic spot image(s) were submitted for interpretation post-operatively. COMPARISON:  None. FINDINGS: Two fluoroscopic spot views obtained in the operating room. Right hip arthroplasty in expected alignment. Total fluoroscopy time 20 seconds. Total dose 2.64 mGy. IMPRESSION: Fluoroscopic spot views after right hip arthroplasty. Electronically Signed   By: Narda Rutherford M.D.   On: 07/21/2020 16:19    Assessment/Plan: 1 Day Post-Op Procedure(s) (LRB): RIGHT TOTAL HIP ARTHROPLASTY DIRECT ANTERIOR (Right) Up with therapy, possible home today.  Eldred Manges 07/22/2020, 8:00 AM

## 2020-07-22 NOTE — Evaluation (Signed)
Physical Therapy Evaluation Patient Details Name: Danielle Schmitt MRN: [id number] DOB: 1956-10-30 Today's Date: 07/22/2020   History of Present Illness  64 yo female with onset of OA on R hip was seen for THA with direct anterior approach, WBAT.  PMHx:  migraine, asthma, B12 defic, OA, HTN, PNA,  B TKA, L THA  Clinical Impression  Pt was seen for mobility on RW, noted that hall walk on 3L led to drop in sat to 87% briefly with pulse 130, recovered to 97% and 97 pulse.  Pt is motivated and will see later in day if time and pt allow to work on stairs due to [employer] of home.  Follow acutely as needed.    Follow Up Recommendations Home health PT;Supervision for mobility/OOB;Follow surgeon's recommendation for DC plan and follow-up therapies    Equipment Recommendations  Rolling walker with 5" wheels (if needed)    Recommendations for Other Services       Precautions / Restrictions Precautions Precautions: Fall Precaution Comments: monitor O2 sats and HR Restrictions Weight Bearing Restrictions: No      Mobility  Bed Mobility Overal bed mobility: Modified Independent             General bed mobility comments: extra time and used bed rail  Transfers Overall transfer level: Modified independent Equipment used: Rolling walker (2 wheeled);1 person hand held assist                Ambulation/Gait Ambulation/Gait assistance: Min guard Gait Distance (Feet): 80 Feet Assistive device: Rolling walker (2 wheeled);1 person hand held assist Gait Pattern/deviations: Step-through pattern;Decreased stride length;Decreased weight shift to right;Decreased stance time - right;Wide base of support Gait velocity: reduced Gait velocity interpretation: <1.31 ft/sec, indicative of household ambulator General Gait Details: pain and feeling of leg length difference on RLE  Stairs Stairs:  (deferred due to sats)          Wheelchair Mobility    Modified Rankin (Stroke Patients Only)        Balance                                             Pertinent Vitals/Pain Pain Assessment: 0-10 Pain Score: 8  Pain Location: R hip during gait Pain Descriptors / Indicators: Operative site guarding;Guarding;Grimacing Pain Intervention(s): Limited activity within patient's tolerance;Monitored during session;Premedicated before session;Repositioned;Patient requesting pain meds-RN notified    Home Living Family/patient expects to be discharged to:: Private residence Living Arrangements: Alone Available Help at Discharge: Family;Friend(s);Available PRN/intermittently Type of Home: House       Home Layout: Two level;Able to live on main level with bedroom/bathroom Home Equipment: [name] - 2 wheels;Cane - single point;Bedside commode;Shower seat      Prior Function Level of Independence: Independent with assistive device(s)         Comments: SPC prior to this surgery     Hand Dominance   Dominant Hand: Right    Extremity/Trunk Assessment   Upper Extremity Assessment Upper Extremity Assessment: Overall WFL for tasks assessed    Lower Extremity Assessment Lower Extremity Assessment: RLE deficits/detail RLE Deficits / Details: mild post op weakness RLE Coordination: decreased gross motor    Cervical / Trunk Assessment Cervical / Trunk Assessment: Kyphotic  Communication   Communication: No difficulties  Cognition Arousal/Alertness: Awake/alert Behavior During Therapy: WFL for tasks assessed/performed Overall Cognitive Status: Within Functional Limits for  tasks assessed                                        General Comments      Exercises     Assessment/Plan    PT Assessment Patient needs continued PT services  PT Problem List Decreased strength;Decreased range of motion;Decreased activity tolerance;Decreased balance;Decreased mobility;Cardiopulmonary status limiting activity;Decreased skin integrity;Pain        PT Treatment Interventions DME instruction;Gait training;Stair training;Functional mobility training;Balance training;Therapeutic activities;Therapeutic exercise;Neuromuscular re-education;Patient/family education    PT Goals (Current goals can be found in the Care Plan section)  Acute Rehab PT Goals Patient Stated Goal: to feel stronger and reduce pain PT Goal Formulation: With patient Time For Goal Achievement: 07/29/20 Potential to Achieve Goals: Good    Frequency 7X/week   Barriers to discharge Inaccessible home environment;Decreased caregiver support has basement to get down and home alone with available PRN help    Co-evaluation               AM-PAC PT "6 Clicks" Mobility  Outcome Measure Help needed turning from your back to your side while in a flat bed without using bedrails?: None Help needed moving from lying on your back to sitting on the side of a flat bed without using bedrails?: None Help needed moving to and from a bed to a chair (including a wheelchair)?: None Help needed standing up from a chair using your arms (e.g., wheelchair or bedside chair)?: None Help needed to walk in hospital room?: A Little Help needed climbing 3-5 steps with a railing? : A Little 6 Click Score: 22    End of Session Equipment Utilized During Treatment: Gait belt;Oxygen Activity Tolerance: Patient tolerated treatment well;Treatment limited secondary to medical complications (Comment) Patient left: in chair;with call bell/phone within reach;with chair alarm set Nurse Communication: Mobility status;Patient requests pain meds PT Visit Diagnosis: Unsteadiness on feet (R26.81);Muscle weakness (generalized) (M62.81);Difficulty in walking, not elsewhere classified (R26.2);Pain Pain - Right/Left: Right Pain - part of body: Hip    Time: 1203-1250 PT Time Calculation (min) (ACUTE ONLY): 47 min   Charges:   PT Evaluation $PT Eval Moderate Complexity: 1 Mod PT Treatments $Gait  Training: 8-22 mins $Therapeutic Activity: 8-22 mins       Ivar Drape 07/22/2020, 1:19 PM   Samul Dada, PT MS Acute Rehab Dept. Number: [facility] [id number] and [facility] [phone]

## 2020-07-22 NOTE — Plan of Care (Signed)
  Problem: Education: Goal: Knowledge of General Education information will improve Description: Including pain rating scale, medication(s)/side effects and non-pharmacologic comfort measures 07/22/2020 0749 by Loma Sousa, RN Outcome: Adequate for Discharge 07/21/2020 1817 by Loma Sousa, RN Outcome: Progressing   Problem: Health Behavior/Discharge Planning: Goal: Ability to manage health-related needs will improve Outcome: Adequate for Discharge   Problem: Clinical Measurements: Goal: Ability to maintain clinical measurements within normal limits will improve Outcome: Adequate for Discharge Goal: Will remain free from infection Outcome: Adequate for Discharge Goal: Diagnostic test results will improve Outcome: Adequate for Discharge Goal: Respiratory complications will improve Outcome: Adequate for Discharge Goal: Cardiovascular complication will be avoided Outcome: Adequate for Discharge   Problem: Activity: Goal: Risk for activity intolerance will decrease Outcome: Adequate for Discharge   Problem: Nutrition: Goal: Adequate nutrition will be maintained 07/22/2020 0749 by Loma Sousa, RN Outcome: Adequate for Discharge 07/21/2020 1817 by Loma Sousa, RN Outcome: Progressing   Problem: Coping: Goal: Level of anxiety will decrease Outcome: Adequate for Discharge   Problem: Elimination: Goal: Will not experience complications related to bowel motility Outcome: Adequate for Discharge Goal: Will not experience complications related to urinary retention Outcome: Adequate for Discharge   Problem: Pain Managment: Goal: General experience of comfort will improve 07/22/2020 0749 by Loma Sousa, RN Outcome: Adequate for Discharge 07/21/2020 1817 by Loma Sousa, RN Outcome: Progressing   Problem: Safety: Goal: Ability to remain free from injury will improve 07/22/2020 0749 by Loma Sousa, RN Outcome: Adequate for  Discharge 07/21/2020 1817 by Loma Sousa, RN Outcome: Progressing   Problem: Skin Integrity: Goal: Risk for impaired skin integrity will decrease Outcome: Adequate for Discharge

## 2020-07-22 NOTE — Progress Notes (Signed)
Physical Therapy Treatment Patient Details Name: Danielle Schmitt MRN: [id number] DOB: 14-Jan-1956 Today's Date: 07/22/2020    History of Present Illness 64 yo female with onset of OA on R hip was seen for THA with direct anterior approach, WBAT.  PMHx:  migraine, asthma, B12 defic, OA, HTN, PNA,  B TKA, L THA    PT Comments    Pt was seen for mobility on RW, continues to desaturate with supplemental O2.  Her fatigue is continuing with pt discussing poor sleep overnight being a factor in how she feels. Pt was up in chair this afternoon, able to reposition well with comfort, and returned to bed with min assist to support back to bed.  Follow acutely for progression of gait, monitoring of vitals and strengthening to BLE's for support of balance and safety to avoid falls.  Follow Up Recommendations  Home health PT;Supervision for mobility/OOB;Follow surgeon's recommendation for DC plan and follow-up therapies     Equipment Recommendations  Rolling walker with 5" wheels (if needed)    Recommendations for Other Services       Precautions / Restrictions Precautions Precautions: Fall Precaution Comments: monitor O2 sats and HR Restrictions Weight Bearing Restrictions: No    Mobility  Bed Mobility Overal bed mobility: Modified Independent             General bed mobility comments: extra time and used bed rail  Transfers Overall transfer level: Modified independent Equipment used: Rolling walker (2 wheeled);1 person hand held assist                Ambulation/Gait Ambulation/Gait assistance: Min guard Gait Distance (Feet): 40 Feet Assistive device: Rolling walker (2 wheeled);1 person hand held assist Gait Pattern/deviations: Step-through pattern;Decreased stride length;Decreased weight shift to right;Decreased stance time - right;Wide base of support Gait velocity: reduced Gait velocity interpretation: <1.31 ft/sec, indicative of household ambulator General Gait Details:  pain and feeling of leg length difference on RLE   Stairs             Wheelchair Mobility    Modified Rankin (Stroke Patients Only)       Balance Overall balance assessment: Needs assistance Sitting-balance support: Feet supported Sitting balance-Leahy Scale: Fair     Standing balance support: Bilateral upper extremity supported;During functional activity Standing balance-Leahy Scale: Poor                              Cognition Arousal/Alertness: Awake/alert Behavior During Therapy: WFL for tasks assessed/performed Overall Cognitive Status: Within Functional Limits for tasks assessed                                        Exercises      General Comments General comments (skin integrity, edema, etc.): pt continues to have struggle maintaining O2 sats with effort to walk, down to 87% again on 2L with supplemental O2      Pertinent Vitals/Pain Pain Assessment: 0-10 Pain Score: 6  Pain Location: R hip during gait Pain Descriptors / Indicators: Operative site guarding;Guarding;Grimacing    Home Living                      Prior Function            PT Goals (current goals can now be found in the care plan section) Acute Rehab  PT Goals Patient Stated Goal: to feel stronger and reduce pain PT Goal Formulation: With patient Time For Goal Achievement: 07/29/20 Potential to Achieve Goals: Good    Frequency    7X/week      PT Plan      Co-evaluation              AM-PAC PT "6 Clicks" Mobility   Outcome Measure  Help needed turning from your back to your side while in a flat bed without using bedrails?: None Help needed moving from lying on your back to sitting on the side of a flat bed without using bedrails?: None Help needed moving to and from a bed to a chair (including a wheelchair)?: None Help needed standing up from a chair using your arms (e.g., wheelchair or bedside chair)?: None Help needed to walk  in hospital room?: A Little Help needed climbing 3-5 steps with a railing? : A Little 6 Click Score: 22    End of Session Equipment Utilized During Treatment: Gait belt;Oxygen Activity Tolerance: Patient tolerated treatment well;Treatment limited secondary to medical complications (Comment) Patient left: in chair;with call bell/phone within reach;with chair alarm set Nurse Communication: Mobility status;Patient requests pain meds PT Visit Diagnosis: Unsteadiness on feet (R26.81);Muscle weakness (generalized) (M62.81);Difficulty in walking, not elsewhere classified (R26.2);Pain Pain - Right/Left: Right Pain - part of body: Hip     Time: 6734-1937 PT Time Calculation (min) (ACUTE ONLY): 23 min  Charges:  $Gait Training: 8-22 mins $Therapeutic Activity: 8-22 mins                    Ivar Drape 07/22/2020, 7:29 PM  Samul Dada, PT MS Acute Rehab Dept. Number: [facility] [id number] and [facility] [phone]

## 2020-07-22 NOTE — TOC Initial Note (Signed)
Transition of Care [facility]) - Initial/Assessment Note    Patient Details  Name: Danielle Schmitt MRN: [id number] Date of Birth: May 16, 1956  [facility] of [facility]) CM/SW Contact:    Curlene Labrum, RN Phone Number: 07/22/2020, 4:11 PM  Clinical Narrative:                 Case management met with the patient S/P anterior hip replacement with Dr. Lorin Mercy.  Patient states that she is being discharged tomorrow since her oxygen is low today after surgery.  Patient given choice regarding home health  - patient chose [facility] and she was set up with PT.  Patient has all dme including a RW and will not need dme before discharge home.  Expected Discharge Plan: [place] Barriers to Discharge: Continued Medical Work up   Patient Goals and CMS Choice Patient states their goals for this hospitalization and ongoing recovery are:: Plans to discharge home tomorrow with home health CMS Medicare.gov Compare Post Acute Care list provided to:: Patient Choice offered to / list presented to : Patient  Expected Discharge Plan and Services Expected Discharge Plan: [place]   Discharge Planning Services: CM Consult Post Acute Care Choice: [place] arrangements for the past 2 months: Single Family Home                           HH Arranged: PT HH Agency:  ([facility] in [place], [place]) Date [place]: 07/22/20 Time [place]: 1610 Representative spoke with at [place]: Intake RN at [place] Arrangements/Services Living arrangements for the past 2 months: [place] with:: Self Patient language and need for interpreter reviewed:: Yes Do you feel safe going back to the place where you live?: Yes      Need for Family Participation in Patient Care: Yes (Comment) Care giver support system in place?: Yes (comment) Current home services: DME Criminal Activity/Legal Involvement Pertinent to  Current Situation/Hospitalization: No - Comment as needed  Activities of Daily Living      Permission Sought/Granted Permission sought to share information with : Case Manager Permission granted to share information with : Yes, Verbal Permission Granted     Permission granted to share info w AGENCY: [place] granted to share info w Relationship: partner, Freda Munro     Emotional Assessment Appearance:: Appears stated age Attitude/Demeanor/Rapport: Gracious Affect (typically observed): Accepting Orientation: : Oriented to Self, Oriented to Place, Oriented to  Time, Oriented to Situation Alcohol / Substance Use: Not Applicable Psych Involvement: No (comment)  Admission diagnosis:  Arthritis of right hip [M16.11] Patient Active Problem List   Diagnosis Date Noted   Arthritis of right hip 07/21/2020   Unilateral primary osteoarthritis, right hip 07/06/2020   Pain in right hip 06/09/2020   Primary osteoarthritis of right knee 10/30/2017   S/P TKR (total knee replacement) using cement, right 10/30/2017   Postoperative anemia due to acute blood loss 06/19/2013   Morbid obesity ([place]) 06/17/2013   Asthma 06/17/2013   PCP:  Sherrilee Gilles, DO Pharmacy:   [id number] [address] [place]Phone: [phone]: [phone]    Social Determinants of Health (SDOH) Interventions    Readmission Risk Interventions No flowsheet data found.

## 2020-07-23 ENCOUNTER — Encounter (HOSPITAL_COMMUNITY): Payer: Self-pay | Admitting: Orthopaedic Surgery

## 2020-07-23 ENCOUNTER — Observation Stay (HOSPITAL_COMMUNITY): Payer: BC Managed Care – PPO

## 2020-07-23 DIAGNOSIS — J9601 Acute respiratory failure with hypoxia: Secondary | ICD-10-CM

## 2020-07-23 DIAGNOSIS — R911 Solitary pulmonary nodule: Secondary | ICD-10-CM | POA: Diagnosis present

## 2020-07-23 DIAGNOSIS — Z96642 Presence of left artificial hip joint: Secondary | ICD-10-CM | POA: Diagnosis present

## 2020-07-23 DIAGNOSIS — J84112 Idiopathic pulmonary fibrosis: Secondary | ICD-10-CM | POA: Diagnosis present

## 2020-07-23 DIAGNOSIS — Z9181 History of falling: Secondary | ICD-10-CM | POA: Diagnosis not present

## 2020-07-23 DIAGNOSIS — J9811 Atelectasis: Secondary | ICD-10-CM | POA: Diagnosis not present

## 2020-07-23 DIAGNOSIS — Z825 Family history of asthma and other chronic lower respiratory diseases: Secondary | ICD-10-CM | POA: Diagnosis not present

## 2020-07-23 DIAGNOSIS — J45909 Unspecified asthma, uncomplicated: Secondary | ICD-10-CM | POA: Diagnosis present

## 2020-07-23 DIAGNOSIS — M25751 Osteophyte, right hip: Secondary | ICD-10-CM | POA: Diagnosis present

## 2020-07-23 DIAGNOSIS — Z8249 Family history of ischemic heart disease and other diseases of the circulatory system: Secondary | ICD-10-CM | POA: Diagnosis not present

## 2020-07-23 DIAGNOSIS — Z20822 Contact with and (suspected) exposure to covid-19: Secondary | ICD-10-CM | POA: Diagnosis present

## 2020-07-23 DIAGNOSIS — D649 Anemia, unspecified: Secondary | ICD-10-CM

## 2020-07-23 DIAGNOSIS — J189 Pneumonia, unspecified organism: Secondary | ICD-10-CM | POA: Diagnosis not present

## 2020-07-23 DIAGNOSIS — I10 Essential (primary) hypertension: Secondary | ICD-10-CM | POA: Diagnosis present

## 2020-07-23 DIAGNOSIS — Z880 Allergy status to penicillin: Secondary | ICD-10-CM | POA: Diagnosis not present

## 2020-07-23 DIAGNOSIS — M8568 Other cyst of bone, other site: Secondary | ICD-10-CM | POA: Diagnosis present

## 2020-07-23 DIAGNOSIS — M1611 Unilateral primary osteoarthritis, right hip: Secondary | ICD-10-CM | POA: Diagnosis present

## 2020-07-23 DIAGNOSIS — Z8701 Personal history of pneumonia (recurrent): Secondary | ICD-10-CM | POA: Diagnosis not present

## 2020-07-23 LAB — HEMOGLOBIN AND HEMATOCRIT, BLOOD
HCT: 30.4 % — ABNORMAL LOW (ref 36.0–46.0)
Hemoglobin: 9.7 g/dL — ABNORMAL LOW (ref 12.0–15.0)

## 2020-07-23 LAB — CBC
HCT: 28.6 % — ABNORMAL LOW (ref 36.0–46.0)
Hemoglobin: 9.5 g/dL — ABNORMAL LOW (ref 12.0–15.0)
MCH: 30.4 pg (ref 26.0–34.0)
MCHC: 33.2 g/dL (ref 30.0–36.0)
MCV: 91.4 fL (ref 80.0–100.0)
Platelets: 341 10*3/uL (ref 150–400)
RBC: 3.13 MIL/uL — ABNORMAL LOW (ref 3.87–5.11)
RDW: 13.1 % (ref 11.5–15.5)
WBC: 10.6 10*3/uL — ABNORMAL HIGH (ref 4.0–10.5)
nRBC: 0 % (ref 0.0–0.2)

## 2020-07-23 LAB — GLUCOSE, CAPILLARY: Glucose-Capillary: 113 mg/dL — ABNORMAL HIGH (ref 70–99)

## 2020-07-23 NOTE — Discharge Instructions (Signed)

## 2020-07-23 NOTE — Progress Notes (Addendum)
   Subjective: 2 Days Post-Op Procedure(s) (LRB): RIGHT TOTAL HIP ARTHROPLASTY DIRECT ANTERIOR (Right) Patient reports pain as moderate.    Objective: Vital signs in last 24 hours: Temp:  [99.6 F (37.6 C)-100.9 F (38.3 C)] 99.6 F (37.6 C) (07/30 0742) Pulse Rate:  [85-101] 85 (07/30 0742) Resp:  [16-18] 18 (07/30 0742) BP: (119-143)/(57-67) 120/57 (07/30 0742) SpO2:  [81 %-98 %] 96 % (07/30 0742)  Intake/Output from previous day: 07/29 0701 - 07/30 0700 In: 600 [P.O.:600] Out: 550 [Urine:550] Intake/Output this shift: No intake/output data recorded.  Recent Labs    07/22/20 0325 07/23/20 0452  HGB 9.9* 9.5*   Recent Labs    07/22/20 0325 07/23/20 0452  WBC 12.7* 10.6*  RBC 3.27* 3.13*  HCT 29.6* 28.6*  PLT 336 341   Recent Labs    07/22/20 0325  NA 136  K 4.4  CL 102  CO2 24  BUN 8  CREATININE 0.70  GLUCOSE 130*  CALCIUM 8.6*   No results for input(s): LABPT, INR in the last 72 hours.  Neurologically intact No results found.  Assessment/Plan: 2 Days Post-Op Procedure(s) (LRB): RIGHT TOTAL HIP ARTHROPLASTY DIRECT ANTERIOR (Right) Up with therapy, desat yesterday with walking. If she is more mobile today could discharge home.   Danielle Schmitt 07/23/2020, 8:10 AM

## 2020-07-23 NOTE — Plan of Care (Signed)

## 2020-07-23 NOTE — Progress Notes (Signed)
Physical Therapy Treatment Patient Details Name: Danielle Schmitt MRN: [id number] DOB: 09-Oct-1956 Today's Date: 07/23/2020    History of Present Illness Pt is a 64 y.o. female s/p elective R THA with direct anterior approach on 07/21/20. Pt with post-op acute hypoxic respiratory failure - Likely multifactorial, atelectasis (seen on CXR), possible mild asthma exacerbation, possible PNA. PMH includes migraines, asthma, OA, HTN, PNA, B TKA, L THA.   PT Comments    Pt continues to move well, remains limited by pain, decreased activity tolerance and desaturation with mobility. This session, pt ultimately required 4L O2 [place] to maintain SpO2 >/88% with activity. SpO2 quick to recover with seated rest; HR maintaining 90s-116. Encouraged more frequent upright mobility and incentive spirometer use. Will continue to follow acutely.    Follow Up Recommendations  Home health PT;Supervision for mobility/OOB;Follow surgeon's recommendation for DC plan and follow-up therapies     Equipment Recommendations  None recommended by PT    Recommendations for Other Services       Precautions / Restrictions Precautions Precautions: Fall;Other (comment) Precaution Comments: monitor O2 sats and HR Restrictions Weight Bearing Restrictions: Yes RLE Weight Bearing: Weight bearing as tolerated    Mobility  Bed Mobility               General bed mobility comments: Received sitting in recliner  Transfers Overall transfer level: Modified independent Equipment used: Rolling walker (2 wheeled)                Ambulation/Gait Ambulation/Gait assistance: Supervision   Assistive device: Rolling walker (2 wheeled)       General Gait Details: Multiple bouts of marching in place with steps forwards/backwards to monitor SpO2 with mobility; frequent seated rest breaks secondary to pain and fatigue. Supervision to monitor sats   Stairs             Wheelchair Mobility    Modified Rankin (Stroke  Patients Only)       Balance Overall balance assessment: Needs assistance Sitting-balance support: Feet supported Sitting balance-Leahy Scale: Good     Standing balance support: Bilateral upper extremity supported;During functional activity;No upper extremity supported Standing balance-Leahy Scale: Fair Standing balance comment: Can static stand without UE support                            Cognition Arousal/Alertness: Awake/alert Behavior During Therapy: WFL for tasks assessed/performed Overall Cognitive Status: Within Functional Limits for tasks assessed                                        Exercises      General Comments General comments (skin integrity, edema, etc.): SpO2 down to 85% on 3L O2 [place] with activity; maintaining >/88% on 4L O2 with activity. HR 113      Pertinent Vitals/Pain Pain Assessment: Faces Faces Pain Scale: Hurts little more Pain Location: R hip Pain Descriptors / Indicators: Operative site guarding;Guarding;Grimacing Pain Intervention(s): Patient requesting pain meds-RN notified;Limited activity within patient's tolerance;Ice applied    Home Living                      Prior Function            PT Goals (current goals can now be found in the care plan section) Progress towards PT goals: Progressing toward goals  Frequency    7X/week      PT Plan Current plan remains appropriate    Co-evaluation              AM-PAC PT "6 Clicks" Mobility   Outcome Measure  Help needed turning from your back to your side while in a flat bed without using bedrails?: None Help needed moving from lying on your back to sitting on the side of a flat bed without using bedrails?: None Help needed moving to and from a bed to a chair (including a wheelchair)?: None Help needed standing up from a chair using your arms (e.g., wheelchair or bedside chair)?: None Help needed to walk in hospital room?: None Help  needed climbing 3-5 steps with a railing? : A Little 6 Click Score: 23    End of Session Equipment Utilized During Treatment: Oxygen Activity Tolerance: Patient tolerated treatment well Patient left: in chair;with call bell/phone within reach (pt refusing chair alarm) Nurse Communication: Mobility status PT Visit Diagnosis: Unsteadiness on feet (R26.81);Muscle weakness (generalized) (M62.81);Difficulty in walking, not elsewhere classified (R26.2);Pain Pain - Right/Left: Right Pain - part of body: Hip     Time: 1530-1550 PT Time Calculation (min) (ACUTE ONLY): 20 min  Charges:  $Therapeutic Exercise: 8-22 mins                    Ina Homes, PT, DPT Acute Rehabilitation Services  Pager [phone] Office [phone]  Malachy Chamber 07/23/2020, 5:00 PM

## 2020-07-23 NOTE — Progress Notes (Signed)
Physical Therapy Treatment Patient Details Name: Danielle Schmitt MRN: [id number] DOB: [birth year] Today's Date: 07/23/2020    History of Present Illness Pt is a 64 y.o. female s/p elective R THA with direct anterior approach on 07/21/20. PMH includes migraines, asthma, OA, HTN, PNA, B TKA, L THA.   PT Comments    Pt progressing with mobility. Pt moving great with RW, but remains limited by DOE and desaturation with mobility. SpO2 down to 83% on RA. Prolonged seated rest, then additional activity trials with SpO2 down to 83% again on 3L O2 [place]. Returning to >/90% with seated rest. Pt with productive cough. Encouraged more frequent incentive spirometer use. Will plan for additional PM session to continue to address O2 needs with mobility.   Follow Up Recommendations  Home health PT;Supervision for mobility/OOB;Follow surgeon's recommendation for DC plan and follow-up therapies     Equipment Recommendations  None recommended by PT    Recommendations for Other Services       Precautions / Restrictions Precautions Precautions: Fall;Other (comment) Precaution Comments: monitor O2 sats and HR Restrictions Weight Bearing Restrictions: Yes RLE Weight Bearing: Weight bearing as tolerated    Mobility  Bed Mobility Overal bed mobility: Modified Independent             General bed mobility comments: Use of LLE to assist RLE to EOB  Transfers Overall transfer level: Modified independent Equipment used: Rolling walker (2 wheeled)                Ambulation/Gait Ambulation/Gait assistance: Supervision Gait Distance (Feet): 200 Feet Assistive device: Rolling walker (2 wheeled) Gait Pattern/deviations: Step-through pattern;Decreased stride length;Decreased weight shift to right;Antalgic   Gait velocity interpretation: 1.31 - 2.62 ft/sec, indicative of limited community ambulator General Gait Details: Slow, antalgic gait with RW at supervision-level. SpO2 down to 83% on RA with HR  120s; additional gait trial with SpO2 down to 83% on 3L O2, recovering to >/90% with seated rest   Stairs             Wheelchair Mobility    Modified Rankin (Stroke Patients Only)       Balance Overall balance assessment: Needs assistance Sitting-balance support: Feet supported Sitting balance-Leahy Scale: Good       Standing balance-Leahy Scale: Fair Standing balance comment: Can static stand without UE support                            Cognition Arousal/Alertness: Awake/alert Behavior During Therapy: WFL for tasks assessed/performed Overall Cognitive Status: Within Functional Limits for tasks assessed                                        Exercises Other Exercises Other Exercises: Multiple bouts of marching in place for SpO2 monitoring    General Comments        Pertinent Vitals/Pain Pain Assessment: Faces Faces Pain Scale: Hurts little more Pain Location: R hip Pain Descriptors / Indicators: Operative site guarding;Guarding;Grimacing Pain Intervention(s): Monitored during session;Repositioned;Ice applied    Home Living Family/patient expects to be discharged to:: Private residence Living Arrangements: Alone                  Prior Function            PT Goals (current goals can now be found in the care plan section) Progress  towards PT goals: Progressing toward goals    Frequency           PT Plan Current plan remains appropriate    Co-evaluation              AM-PAC PT "6 Clicks" Mobility   Outcome Measure  Help needed turning from your back to your side while in a flat bed without using bedrails?: None Help needed moving from lying on your back to sitting on the side of a flat bed without using bedrails?: None Help needed moving to and from a bed to a chair (including a wheelchair)?: None Help needed standing up from a chair using your arms (e.g., wheelchair or bedside chair)?: None Help  needed to walk in hospital room?: None Help needed climbing 3-5 steps with a railing? : A Little 6 Click Score: 23    End of Session Equipment Utilized During Treatment: Gait belt;Oxygen Activity Tolerance: Patient tolerated treatment well Patient left: in chair;with call bell/phone within reach (pt refused chair alarm) Nurse Communication: Mobility status PT Visit Diagnosis: Unsteadiness on feet (R26.81);Muscle weakness (generalized) (M62.81);Difficulty in walking, not elsewhere classified (R26.2);Pain Pain - Right/Left: Right Pain - part of body: Hip     Time: 8811-0315 PT Time Calculation (min) (ACUTE ONLY): 35 min  Charges:  $Gait Training: 8-22 mins $Therapeutic Exercise: 8-22 mins                    Ina Homes, PT, DPT Acute Rehabilitation Services  Pager [phone] Office [phone]  Malachy Chamber 07/23/2020, 9:36 AM

## 2020-07-23 NOTE — Consult Note (Addendum)
Medical Consultation   Danielle Schmitt  KZS:[id number]  DOB: [birth year]  DOA: 07/21/2020  PCP: Jonathon Bellows, DO    Requesting physician: Dr. Ophelia Charter  Reason for consultation: Desaturations with ambulation   History of Present Illness: Danielle Schmitt is an 64 y.o. female with a history of functional disability and pain in right hip secondary to arthritis, failed outpatient conservative measures, asthma, hypertension who presented to [facility] for surgical management of her end-stage degenerative joint disease of the right hip with total hip arthroplasty on 07/21/2020 with Dr. Ophelia Charter.  Plan was initially home on 7/29 however she had an SPO2 dropped to 87% briefly with pulse of 130 which recovered to 97% on 3 L nasal cannula while working with PT and DC was held off.  Additionally, she has been noted to have a Hb drop from 13.3 on 7/26->9.9 yesterday and 9.5 today (baseline is 9.4-9.9)  The patient states that she has a strong family history of pulmonary fibrosis in father and siblings and she has a history of asthma but has never been tested for IPF. Normally does not wear oxygen at home. Low grade fever overnight and received Tylenol for this. Also did not start taking her Virgel Bouquet that she takes at home until this morning   Review of Systems:  Review of Systems  Constitutional: Positive for fever. Negative for chills.  Respiratory: Positive for cough and shortness of breath.        Occasionally bringing up sputum  Cardiovascular: Positive for leg swelling. Negative for chest pain.  Gastrointestinal: Negative for nausea.  All other systems reviewed and are negative.  As per HPI otherwise 10 point review of systems negative.    Past Medical History: Past Medical History:  Diagnosis Date  . Arthritis    "shoulders, knees, fingers, hips" (10/31/2017)  . Asthma   . B12 deficiency anemia   . Chronic lower back pain   . Complication of anesthesia    Hx: of bronchospasm attack during  procedure- hysterectomy  . Degenerative joint disease   . Depression   . Environmental allergies   . History of blood transfusion    left knee replacement and hip replacement  . Hypertension   . Incontinence of urine    "due to vaginal hysterectomy; I have pessary in place" (10/31/2017)  . Iron deficiency anemia   . Migraine    "any time weather/pressure changes"   . Pneumonia    "several times; last time was ~ 2014" (10/31/2017)  . Pseudogout of knee, right   . Spinal headache    ;Hx: of headache with Epidural; migraines    Past Surgical History: Past Surgical History:  Procedure Laterality Date  . BACK SURGERY    . CARPAL TUNNEL RELEASE Bilateral   . COLONOSCOPY W/ BIOPSIES AND POLYPECTOMY     Hx: of  . JOINT REPLACEMENT    . KNEE ARTHROPLASTY Left   . KNEE ARTHROSCOPY Bilateral "numerous "  . KNEE RECONSTRUCTION Right    Insoll procedure  . SHOULDER ARTHROSCOPY W/ ROTATOR CUFF REPAIR Right   . SYNOVIAL CYST EXCISION     "inside spinal column"  . TONSILLECTOMY AND ADENOIDECTOMY  1998  . TOTAL HIP ARTHROPLASTY Left 06/17/2013   Procedure: LEFT TOTAL HIP ARTHROPLASTY;  Surgeon: Valeria Batman, MD;  Location[facility] OR;  Service: Orthopedics;  Laterality: Left;  . TOTAL HIP ARTHROPLASTY Right 07/21/2020   Procedure: RIGHT TOTAL  HIP ARTHROPLASTY DIRECT ANTERIOR;  Surgeon: Eldred Manges, MD;  Location: [facility] OR;  Service: Orthopedics;  Laterality: Right;  . TOTAL KNEE ARTHROPLASTY Right 10/30/2017   Procedure: RIGHT TOTAL KNEE ARTHROPLASTY;  Surgeon: Valeria Batman, MD;  Location: [facility] OR;  Service: Orthopedics;  Laterality: Right;  . TOTAL KNEE ARTHROPLASTY Right 10/30/2017  . VAGINAL HYSTERECTOMY     total     Allergies:   Allergies  Allergen Reactions  . Augmentin [Amoxicillin-Pot Clavulanate] Rash and Other (See Comments)    Allergic to the Pot-clavulante in the augmentin     Social History:  reports that she has never smoked. She has never used smokeless tobacco.  She reports current alcohol use. She reports that she does not use drugs.   Family History: Family History  Problem Relation Age of Onset  . Cancer - Lung Mother   . Hypertension Mother   . Hypertension Father   . Kidney failure Father   . Hypertension Brother      Acceptable: Family history reviewed and pertinent, history of IPF in father, brother and sister.   Physical Exam: Vitals:   07/23/20 0720 07/23/20 0735 07/23/20 0742 07/23/20 0923  BP:   (!) 120/57   Pulse:   85 97  Resp:   18 18  Temp:   99.6 F (37.6 C)   TempSrc:   Oral   SpO2: (!) 81% 93% 96% 94%  Weight:      Height:        Physical Exam Vitals and nursing note reviewed.  Constitutional:      General: She is not in acute distress. HENT:     Head: Normocephalic.     Mouth/Throat:     Mouth: Mucous membranes are moist.  Eyes:     Conjunctiva/sclera: Conjunctivae normal.  Cardiovascular:     Rate and Rhythm: Normal rate and regular rhythm.  Pulmonary:     Effort: Pulmonary effort is normal.     Comments: Coughing during exam No rales noted Inspiratory squeaks noted in RUL, RLL and LLL Musculoskeletal:     Right lower leg: No edema.     Left lower leg: No edema.     Comments: Post surgical hip  Neurological:     Mental Status: She is alert. Mental status is at baseline.  Psychiatric:        Mood and Affect: Mood normal.        Behavior: Behavior normal.      Data reviewed:  I have personally reviewed following labs and imaging studies Labs:  CBC: Recent Labs  Lab 07/19/20 1044 07/22/20 0325 07/23/20 0452  WBC 12.0* 12.7* 10.6*  HGB 13.3 9.9* 9.5*  HCT 41.7 29.6* 28.6*  MCV 92.9 90.5 91.4  PLT 429* 336 341    Basic Metabolic Panel: Recent Labs  Lab 07/19/20 1044 07/22/20 0325  NA 138 136  K 4.2 4.4  CL 105 102  CO2 23 24  GLUCOSE 102* 130*  BUN 20 8  CREATININE 0.70 0.70  CALCIUM 9.3 8.6*   GFR Estimated Creatinine Clearance: 82.5 mL/min (by C-G formula based on SCr  of 0.7 mg/dL). Liver Function Tests: Recent Labs  Lab 07/19/20 1044  AST 23  ALT 21  ALKPHOS 76  BILITOT 0.3  PROT 7.8  ALBUMIN 4.2   No results for input(s): LIPASE, AMYLASE in the last 168 hours. No results for input(s): AMMONIA in the last 168 hours. Coagulation profile No results for input(s): INR, PROTIME in the  last 168 hours.  Cardiac Enzymes: No results for input(s): CKTOTAL, CKMB, CKMBINDEX, TROPONINI in the last 168 hours. BNP: Invalid input(s): POCBNP CBG: Recent Labs  Lab 07/22/20 0640  GLUCAP 110*   D-Dimer No results for input(s): DDIMER in the last 72 hours. Hgb A1c No results for input(s): HGBA1C in the last 72 hours. Lipid Profile No results for input(s): CHOL, HDL, LDLCALC, TRIG, CHOLHDL, LDLDIRECT in the last 72 hours. Thyroid function studies No results for input(s): TSH, T4TOTAL, T3FREE, THYROIDAB in the last 72 hours.  Invalid input(s): FREET3 Anemia work up No results for input(s): VITAMINB12, FOLATE, FERRITIN, TIBC, IRON, RETICCTPCT in the last 72 hours. Urinalysis    Component Value Date/Time   COLORURINE YELLOW 07/19/2020 1044   APPEARANCEUR CLEAR 07/19/2020 1044   LABSPEC 1.017 07/19/2020 1044   PHURINE 5.0 07/19/2020 1044   GLUCOSEU NEGATIVE 07/19/2020 1044   HGBUR NEGATIVE 07/19/2020 1044   BILIRUBINUR NEGATIVE 07/19/2020 1044   KETONESUR NEGATIVE 07/19/2020 1044   PROTEINUR NEGATIVE 07/19/2020 1044   UROBILINOGEN 0.2 06/18/2013 1517   NITRITE NEGATIVE 07/19/2020 1044   LEUKOCYTESUR SMALL (A) 07/19/2020 1044     Microbiology Recent Results (from the past 240 hour(s))  Surgical pcr screen     Status: Abnormal   Collection Time: 07/19/20 10:44 AM   Specimen: Nasal Mucosa; Nasal Swab  Result Value Ref Range Status   MRSA, PCR POSITIVE (A) NEGATIVE Final   Staphylococcus aureus POSITIVE (A) NEGATIVE Final    Comment: (NOTE) The Xpert SA Assay (FDA approved for NASAL specimens in patients 47 years of age and older), is one  component of a comprehensive surveillance program. It is not intended to diagnose infection nor to guide or monitor treatment. Performed at [facility], [address] [place], [place] [address]   SARS CORONAVIRUS 2 (TAT 6-24 HRS) Nasopharyngeal Nasopharyngeal Swab     Status: None   Collection Time: 07/19/20 11:21 AM   Specimen: Nasopharyngeal Swab  Result Value Ref Range Status   SARS Coronavirus 2 NEGATIVE NEGATIVE Final    Comment: (NOTE) SARS-CoV-2 target nucleic acids are NOT DETECTED.  The SARS-CoV-2 RNA is generally detectable in upper and lower respiratory specimens during the acute phase of infection. Negative results do not preclude SARS-CoV-2 infection, do not rule out co-infections with other pathogens, and should not be used as the sole basis for treatment or other patient management decisions. Negative results must be combined with clinical observations, patient history, and epidemiological information. The expected result is Negative.  Fact Sheet for Patients: HairSlick.no  Fact Sheet for Healthcare Providers: quierodirigir.com  This test is not yet approved or cleared by the [place] FDA and  has been authorized for detection and/or diagnosis of SARS-CoV-2 by FDA under an Emergency Use Authorization (EUA). This EUA will remain  in effect (meaning this test can be used) for the duration of the COVID-19 declaration under Se ction 564(b)(1) of the Act, 21 U.S.C. section 360bbb-3(b)(1), unless the authorization is terminated or revoked sooner.  Performed at [facility], [address], [place], [place] [address]        Inpatient Medications:   Scheduled Meds: . aspirin EC  325 mg Oral Q breakfast  . [START ON 08/18/2020] cyanocobalamin  1,000 mcg Intramuscular Q30 days  . docusate sodium  100 mg Oral BID  . ferrous sulfate  325 mg Oral Daily  . FLUoxetine  40 mg Oral Daily  .  fluticasone furoate-vilanterol  1 puff Inhalation Daily  .  gabapentin  200 mg Oral TID  . lisinopril  20 mg Oral Daily   And  . hydrochlorothiazide  25 mg Oral Daily  . loratadine  10 mg Oral Daily  . montelukast  10 mg Oral Daily  . [START ON 07/28/2020] Vitamin D (Ergocalciferol)  50,000 Units Oral Q Wed   Continuous Infusions: . sodium chloride 85 mL/hr at 07/22/20 0633  . methocarbamol (ROBAXIN) IV       Radiological Exams on Admission: DG C-Arm 1-60 Min  Result Date: 07/21/2020 CLINICAL DATA:  Elective surgery.  Right hip replacement. EXAM: OPERATIVE RIGHT HIP (WITH PELVIS IF PERFORMED) TECHNIQUE: Fluoroscopic spot image(s) were submitted for interpretation post-operatively. COMPARISON:  None. FINDINGS: Two fluoroscopic spot views obtained in the operating room. Right hip arthroplasty in expected alignment. Total fluoroscopy time 20 seconds. Total dose 2.64 mGy. IMPRESSION: Fluoroscopic spot views after right hip arthroplasty. Electronically Signed   By: Narda RutherfordMelanie  Sanford M.D.   On: 07/21/2020 16:19   DG Hip Port Unilat With Pelvis 1V Right  Result Date: 07/21/2020 CLINICAL DATA:  Postop right hip arthroplasty. EXAM: DG HIP (WITH OR WITHOUT PELVIS) 1V PORT RIGHT COMPARISON:  None. FINDINGS: Right hip arthroplasty in expected alignment. No periprosthetic lucency or fracture. Recent postsurgical change includes air and edema in the soft tissues with lateral skin staples. Left hip arthroplasty is also noted. IMPRESSION: Right hip arthroplasty without immediate postoperative complication. Electronically Signed   By: Narda RutherfordMelanie  Sanford M.D.   On: 07/21/2020 16:18   DG HIP OPERATIVE UNILAT W OR W/O PELVIS RIGHT  Result Date: 07/21/2020 CLINICAL DATA:  Elective surgery.  Right hip replacement. EXAM: OPERATIVE RIGHT HIP (WITH PELVIS IF PERFORMED) TECHNIQUE: Fluoroscopic spot image(s) were submitted for interpretation post-operatively. COMPARISON:  None. FINDINGS: Two fluoroscopic spot views  obtained in the operating room. Right hip arthroplasty in expected alignment. Total fluoroscopy time 20 seconds. Total dose 2.64 mGy. IMPRESSION: Fluoroscopic spot views after right hip arthroplasty. Electronically Signed   By: Narda RutherfordMelanie  Sanford M.D.   On: 07/21/2020 16:19    Impression/Recommendations Active Problems:   Unilateral primary osteoarthritis, right hip   Arthritis of right hip  1. Acute hypoxic respiratory failure 1. Likely multifactorial: Atelectasis (seen on CXR), possible mild asthma exacerbation  (not taking her Breo until today), possible pneumonia (low grade fever 100.73F overnight, with WBC12.7->10.6, cough) 2. With a strong family history of idiopathic pulmonary fibrosis, underlying asthma and squeaks on pulmonary exam without any previous imaging, I think it is important to rule this out with a hi-res CT chest, will order. 3. Continue Incentive spirometer 4. Add flutter valve 5. Continue Singulair, Breo Ellipta and PRN albuterol 6. Fever can be from atelectasis or developing pneumonia. WBC improving from yesterday. If she has recurrence of fever despite above interventions then I would start CAP therapy with Ceftriaxone and Azithromycin  2. Normocytic anemia 1. Hb drop from 13.3 on 7/26 to 9.5 today, baseline is 9.4-9.5 2. Hb 13.3 could be hemoconcentration 3. Repeat H/H this afternoon to ensure it does not continue to drop  3. Right hip pain s/p total hip arthroplasty on 07/21/2020 with Dr. Ophelia CharterYates 1. Management per primary   Thank you for this consultation.  [facility] will follow the patient with you.   Time Spent: 65 minutes  Jae DireJared E Lupe Bonner M.D. [facility] 07/23/2020, 9:55 AM

## 2020-07-24 DIAGNOSIS — Z96649 Presence of unspecified artificial hip joint: Secondary | ICD-10-CM

## 2020-07-24 DIAGNOSIS — Z419 Encounter for procedure for purposes other than remedying health state, unspecified: Secondary | ICD-10-CM

## 2020-07-24 NOTE — Progress Notes (Addendum)
PT Cancellation Note  Patient Details Name: KYNA BLAHNIK MRN: [id number] DOB: [birth year]   Cancelled Treatment:    Reason Eval/Treat Not Completed: Patient declined, no reason specified. I did miss the early morning treatment, but on afternoon return pt declined any intervention, exercise or ambulation.  "I'm going home and this isn't my first rodeo".  "I'm sorry"`  07/24/2020  Jacinto Halim., PT Acute Rehabilitation Services [phone]  (pager) [phone]  (office)   Eliseo Gum Sabastian Raimondi 07/24/2020, 3:28 PM

## 2020-07-24 NOTE — Progress Notes (Signed)
Discharge home once oxygen available for pt to go home in vehicle and also available at her home. Suppose to be ready Sunday so will put in discharge orders for Sunday once O2 is there.

## 2020-07-24 NOTE — Progress Notes (Signed)
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) and requests follow-up in [place] [facility] with [facility] pulmonology although I have offered her follow-up care with [facility] At this time I would not add any steroids or disease modifying therapies because she will probably need PFTs in addition and she has an underlying asthma diagnosis 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

## 2020-07-24 NOTE — Progress Notes (Signed)
SATURATION QUALIFICATIONS: (This note is used to comply with regulatory documentation for home oxygen)  Patient Saturations on Room Air at Rest = 89%  Patient Saturations on Room Air while Ambulating = 87%  Patient Saturations on 4 Liters of oxygen while Ambulating = 93%  Please briefly explain why patient needs home oxygen: The patient desats when ambulating.

## 2020-07-24 NOTE — TOC Transition Note (Addendum)
Transition of Care [facility]) - CM/SW Discharge Note   Patient Details  Name: Danielle Schmitt MRN: [id number] Date of Birth: [birth year]  Transition of Care [facility]) CM/SW Contact:  Deveron Furlong, RN 07/24/2020, 5:03 PM   Clinical Narrative:    Patient to be dc with DME O2 at 4lpm. Oxygen ordered from [facility].  Since no d/c order written, O2 will be delivered tomorrow morning.    Discussed with RN Danne Harbor.  His attempts to contact ortho MD for d/c order have been unsuccessful.     Final next level of care: Home w Home Health Services Barriers to Discharge: Continued Medical Work up   Patient Goals and CMS Choice Patient states their goals for this hospitalization and ongoing recovery are:: Plans to discharge home tomorrow with home health CMS Medicare.gov Compare Post Acute Care list provided to:: Patient Choice offered to / list presented to : Patient   Discharge Plan and Services   Discharge Planning Services: CM Consult Post Acute Care Choice: Home Health            DME Agency:  [facility]) Date DME Agency Contacted: 07/24/20 Time DME Agency Contacted: [phone] Representative spoke with at DME Agency: Vaughan Basta HH Arranged: PT HH Agency:  ([facility] in [place], [place]) Date [facility] Agency Contacted: 07/22/20 Time HH Agency Contacted: [id number] Representative spoke with at [facility] Agency: Intake RN at [employer]

## 2020-07-24 NOTE — Progress Notes (Signed)
     Subjective: 3 Days Post-Op Procedure(s) (LRB): RIGHT TOTAL HIP ARTHROPLASTY DIRECT ANTERIOR (Right) Awake alert and oriented x 4. Respiratory deterioration post surgery, family history of pulmonary fibrosis, early death father, brother with bilateral lung transplant at age 62. I reviewed the results of the chest CT scan and there is significant signs of Chronic pulmonary condition. Internal medicine has been consulted. Most if not all of changes appear chronic so I believe she will be ready for discharge today pending medicines eval and follow up.   Patient reports pain as mild.    Objective:   VITALS:  Temp:  [98.9 F (37.2 C)-99.6 F (37.6 C)] 98.9 F (37.2 C) (07/31 0412) Pulse Rate:  [80-100] 80 (07/31 0412) Resp:  [15-17] 15 (07/31 0412) BP: (87-106)/(51-66) 106/53 (07/31 0412) SpO2:  [97 %-99 %] 97 % (07/31 0843)  Neurologically intact ABD soft Neurovascular intact Sensation intact distally Intact pulses distally Dorsiflexion/Plantar flexion intact Incision: no drainage and tiny speck of dry blood with circle around it no enlargment no significant dressing saturation.    LABS Recent Labs    07/22/20 0325 07/23/20 0452 07/23/20 1331  HGB 9.9* 9.5* 9.7*  WBC 12.7* 10.6*  --   PLT 336 341  --    Recent Labs    07/22/20 0325  NA 136  K 4.4  CL 102  CO2 24  BUN 8  CREATININE 0.70  GLUCOSE 130*   No results for input(s): LABPT, INR in the last 72 hours.   Assessment/Plan: 3 Days Post-Op Procedure(s) (LRB): RIGHT TOTAL HIP ARTHROPLASTY DIRECT ANTERIOR (Right)  Advance diet Up with therapy D/C IV fluids Discharge home with home health  Medicine to see. Likely home with O2 pnc but this is dependent on medicine eval.  Vira Browns 07/24/2020, 10:08 AMPatient ID: Danielle Schmitt, female   DOB: 01-04-1956, 64 y.o.   MRN: [id number]

## 2020-07-25 NOTE — Progress Notes (Signed)
Physical Therapy Treatment Patient Details Name: Danielle Schmitt MRN: [id number] DOB: 1956-07-09 Today's Date: 07/25/2020    History of Present Illness Pt is a 64 y.o. female s/p elective R THA with direct anterior approach on 07/21/20. Pt with post-op acute hypoxic respiratory failure - Likely multifactorial, atelectasis (seen on CXR), possible mild asthma exacerbation, possible PNA. PMH includes migraines, asthma, OA, HTN, PNA, B TKA, L THA.    PT Comments    Pt has made excellent progress towards her mobility goals during her inpatient stay. Ambulating 250 feet with a walker without physical difficulty. Demonstrates good step through pattern and gait speed. Cues provided for neutral foot positioning and pt able to achieve. SpO2 97% on 4L O2, HR stable. Education provided regarding exercise recommendations and energy conservation techniques. Pt with no further questions/concerns. Ok for discharge from PT perspective.     Follow Up Recommendations  Outpatient PT     Equipment Recommendations  None recommended by PT    Recommendations for Other Services       Precautions / Restrictions Precautions Precautions: Other (comment) Precaution Comments: watch O2 Restrictions RLE Weight Bearing: Weight bearing as tolerated    Mobility  Bed Mobility Overal bed mobility: Modified Independent             General bed mobility comments: HOB slightly elevated  Transfers Overall transfer level: Modified independent                  Ambulation/Gait Ambulation/Gait assistance: Modified independent (Device/Increase time) Gait Distance (Feet): 250 Feet Assistive device: Rolling walker (2 wheeled) Gait Pattern/deviations: Step-through pattern;Decreased weight shift to right     General Gait Details: Cues for neutral right foot positioning. Good speed and right heel strike at initial contact   Stairs             Wheelchair Mobility    Modified Rankin (Stroke Patients  Only)       Balance Overall balance assessment: Needs assistance Sitting-balance support: Feet supported Sitting balance-Leahy Scale: Good     Standing balance support: No upper extremity supported;During functional activity Standing balance-Leahy Scale: Good                              Cognition Arousal/Alertness: Awake/alert Behavior During Therapy: WFL for tasks assessed/performed Overall Cognitive Status: Within Functional Limits for tasks assessed                                        Exercises      General Comments        Pertinent Vitals/Pain Pain Assessment: Faces Faces Pain Scale: Hurts a little bit Pain Location: R hip Pain Descriptors / Indicators: Operative site guarding Pain Intervention(s): Monitored during session    Home Living                      Prior Function            PT Goals (current goals can now be found in the care plan section) Acute Rehab PT Goals Patient Stated Goal: improved respiratory function Potential to Achieve Goals: Good Progress towards PT goals: Progressing toward goals    Frequency    7X/week      PT Plan Discharge plan needs to be updated    Co-evaluation  AM-PAC PT "6 Clicks" Mobility   Outcome Measure  Help needed turning from your back to your side while in a flat bed without using bedrails?: None Help needed moving from lying on your back to sitting on the side of a flat bed without using bedrails?: None Help needed moving to and from a bed to a chair (including a wheelchair)?: None Help needed standing up from a chair using your arms (e.g., wheelchair or bedside chair)?: None Help needed to walk in hospital room?: None Help needed climbing 3-5 steps with a railing? : None 6 Click Score: 24    End of Session Equipment Utilized During Treatment: Oxygen Activity Tolerance: Patient tolerated treatment well Patient left: in chair;with call  bell/phone within reach   PT Visit Diagnosis: Unsteadiness on feet (R26.81);Muscle weakness (generalized) (M62.81);Difficulty in walking, not elsewhere classified (R26.2);Pain Pain - Right/Left: Right Pain - part of body: Hip     Time: 9381-8299 PT Time Calculation (min) (ACUTE ONLY): 18 min  Charges:  $Gait Training: 8-22 mins                       Lillia Pauls, PT, DPT Acute Rehabilitation Services Pager [phone] Office [phone]    Norval Morton 07/25/2020, 9:03 AM

## 2020-07-25 NOTE — Progress Notes (Signed)
Pt is ready for discharge. IV removed, Vitals Stable, Pt had a 7/10 pain from ambulating the unit, was treated with 5 mg of Oxycodone per orders, pain treated effectively. Pt will be discharged with home O2 she is currently 98% on 2L [place] at rest. AVS given and explained to patient and the patient has no further questions or complaints.

## 2020-07-26 NOTE — Discharge Summary (Signed)
Patient ID: Danielle Schmitt MRN: [id number] DOB/AGE: 62-15-1957 62 y.o.  Admit date: 07/21/2020 Discharge date: July 25, 2020 Admission Diagnoses:  Active Problems:   Unilateral primary osteoarthritis, right hip   Arthritis of right hip   H/O total hip arthroplasty   Discharge Diagnoses:  Active Problems:   Unilateral primary osteoarthritis, right hip   Arthritis of right hip   H/O total hip arthroplasty  status post Procedure(s): RIGHT TOTAL HIP ARTHROPLASTY DIRECT ANTERIOR  Past Medical History:  Diagnosis Date  . Arthritis    "shoulders, knees, fingers, hips" (10/31/2017)  . Asthma   . B12 deficiency anemia   . Chronic lower back pain   . Complication of anesthesia    Hx: of bronchospasm attack during procedure- hysterectomy  . Degenerative joint disease   . Depression   . Environmental allergies   . History of blood transfusion    left knee replacement and hip replacement  . Hypertension   . Incontinence of urine    "due to vaginal hysterectomy; I have pessary in place" (10/31/2017)  . Iron deficiency anemia   . Migraine    "any time weather/pressure changes"   . Pneumonia    "several times; last time was ~ 2014" (10/31/2017)  . Pseudogout of knee, right   . Spinal headache    ;Hx: of headache with Epidural; migraines    Surgeries: Procedure(s): RIGHT TOTAL HIP ARTHROPLASTY DIRECT ANTERIOR on 07/21/2020   Consultants:   Discharged Condition: Improved  Hospital Course: Danielle Schmitt is an 64 y.o. female who was admitted 07/21/2020 for operative treatment of right hip arthritis. Patient failed conservative treatments (please see the history and physical for the specifics) and had severe unremitting pain that affects sleep, daily activities and work/hobbies. After pre-op clearance, the patient was taken to the operating room on 07/21/2020 and underwent  Procedure(s): RIGHT TOTAL HIP ARTHROPLASTY DIRECT ANTERIOR.    Patient was given perioperative antibiotics:   Anti-infectives (From admission, onward)   Start     Dose/Rate Route Frequency Ordered Stop   07/21/20 1045  vancomycin (VANCOCIN) IVPB 1000 mg/200 mL premix        1,000 mg 200 mL/hr over 60 Minutes Intravenous On call to O.R. 07/21/20 1042 07/21/20 1229       Patient was given sequential compression devices and early ambulation to prevent DVT.   Patient benefited maximally from hospital stay and there were no complications. At the time of discharge, the patient was urinating/moving their bowels without difficulty, tolerating a regular diet, pain is controlled with oral pain medications and they have been cleared by PT/OT.   Recent vital signs: No data found.   Recent laboratory studies: No results for input(s): WBC, HGB, HCT, PLT, NA, K, CL, CO2, BUN, CREATININE, GLUCOSE, INR, CALCIUM in the last 72 hours.  Invalid input(s): PT, 2   Discharge Medications:   Allergies as of 07/25/2020      Reactions   Augmentin [amoxicillin-pot Clavulanate] Rash, Other (See Comments)   Allergic to the Pot-clavulante in the augmentin      Medication List    STOP taking these medications   acetaminophen 500 MG tablet Commonly known as: TYLENOL   amoxicillin 500 MG capsule Commonly known as: AMOXIL   celecoxib 200 MG capsule Commonly known as: CELEBREX   traMADol 50 MG tablet Commonly known as: ULTRAM     TAKE these medications   ALAWAY OP Place 1 drop into both eyes daily as needed (for allergies).  albuterol 108 (90 Base) MCG/ACT inhaler Commonly known as: VENTOLIN HFA Inhale 2 puffs into the lungs every 6 (six) hours as needed for wheezing.   aspirin 325 MG tablet Commonly known as: Bayer Aspirin Take 1 tablet (325 mg total) by mouth daily. Take one daily for 4 wks then stop and resume Celebrex   Breo Ellipta 100-25 MCG/INH Aepb Generic drug: fluticasone furoate-vilanterol Inhale 1 puff into the lungs daily.   cyanocobalamin 1000 MCG/ML injection Commonly known as:  (VITAMIN B-12) Inject 1,000 mcg into the muscle every 30 (thirty) days.   ergocalciferol 1.25 MG (50000 UT) capsule Commonly known as: VITAMIN D2 Take 50,000 Units by mouth every Wednesday.   ferrous sulfate 325 (65 FE) MG tablet Take 325 mg by mouth daily.   FLUoxetine 40 MG capsule Commonly known as: PROZAC Take 40 mg by mouth daily.   fluticasone 50 MCG/ACT nasal spray Commonly known as: FLONASE Place 1 spray into both nostrils 2 (two) times daily as needed for allergies or rhinitis.   gabapentin 100 MG capsule Commonly known as: NEURONTIN Take 200 mg by mouth in the morning, at noon, and at bedtime.   lisinopril-hydrochlorothiazide 20-25 MG tablet Commonly known as: ZESTORETIC Take 1 tablet by mouth daily.   loratadine 10 MG tablet Commonly known as: CLARITIN Take 10 mg by mouth daily.   methocarbamol 750 MG tablet Commonly known as: ROBAXIN Take 750 mg by mouth 3 (three) times daily.   montelukast 10 MG tablet Commonly known as: SINGULAIR Take 10 mg by mouth daily.   multivitamin with minerals Tabs tablet Take 1 tablet by mouth daily.   oxyCODONE-acetaminophen 5-325 MG tablet Commonly known as: Percocet Take 1-2 tablets by mouth every 6 (six) hours as needed for severe pain. Post op total hip   Voltaren 1 % Gel Generic drug: diclofenac Sodium Apply 1 application topically 4 (four) times daily as needed (pain.).       Diagnostic Studies: DG Chest 2 View  Result Date: 07/23/2020 CLINICAL DATA:  Shortness of breath, productive cough EXAM: CHEST - 2 VIEW COMPARISON:  10/19/2017 FINDINGS: Low lung volumes. Diffuse interstitial prominence throughout the lungs is increased since prior study. Heart is upper limits normal in size. Bibasilar atelectasis. No effusions or pneumothorax. No acute bony abnormality. IMPRESSION: Low lung volumes with bibasilar atelectasis. Diffuse interstitial prominence within the lungs could reflect edema or atypical infection.  Electronically Signed   By: Charlett NoseKevin  Dover M.D.   On: 07/23/2020 10:01   CT Chest High Resolution  Result Date: 07/23/2020 CLINICAL DATA:  Shortness of breath.  Asthma.  Recent hip surgery. EXAM: CT CHEST WITHOUT CONTRAST TECHNIQUE: Multidetector CT imaging of the chest was performed following the standard protocol without intravenous contrast. High resolution imaging of the lungs, as well as inspiratory and expiratory imaging, was performed. COMPARISON:  None. FINDINGS: Cardiovascular: Atherosclerotic calcification of the aortic valve and coronary arteries. Pulmonic trunk and heart are enlarged. No pericardial effusion. Mediastinum/Nodes: Mediastinal lymph nodes are not enlarged by CT size criteria. Hilar regions are difficult to definitively evaluate without IV contrast. No axillary adenopathy. Esophagus is grossly unremarkable. Tiny hiatal hernia. Lungs/Pleura: There is patchy pulmonary parenchymal ground-glass with interstitial coarsening, traction bronchiectasis/bronchiolectasis and mild architectural distortion. Findings may be slightly upper/midlung zone predominant. Mild air trapping. 5 mm left lower lobe nodule (6/69). No pleural fluid. Airway is unremarkable. Upper Abdomen: Right hemidiaphragm is elevated. Liver is somewhat heterogeneous in appearance. Visualized portions of the liver and gallbladder are otherwise unremarkable. Fluid density nodule  in the body of the right adrenal gland measures 1.3 cm. Visualized portion of the left adrenal gland is unremarkable. 11 mm low-attenuation lesion in the upper pole right kidney is too small to characterize. Visualized portions of the kidneys, spleen, pancreas, stomach and bowel are otherwise unremarkable with the exception of a small hiatal hernia. No upper abdominal adenopathy. Musculoskeletal: Degenerative changes in the spine. No worrisome lytic or sclerotic lesions. IMPRESSION: 1. Pulmonary parenchymal pattern of fibrosis, together with mild air trapping,  can be seen with chronic hypersensitivity pneumonitis or fibrotic nonspecific interstitial pneumonitis. Findings are suggestive of an alternative diagnosis (not UIP) per consensus guidelines: Diagnosis of Idiopathic Pulmonary Fibrosis: An Official ATS/ERS/JRS/ALAT Clinical Practice Guideline. Am Rosezetta Schlatter Crit Care Med Vol 198, Iss 5, [phone], Aug 25 2017. 2. 5 mm left lower lobe nodule. No follow-up needed if patient is low-risk. Non-contrast chest CT can be considered in 12 months if patient is high-risk. This recommendation follows the consensus statement: Guidelines for Management of Incidental Pulmonary Nodules Detected on CT Images: From the Fleischner Society 2017; Radiology 2017; 284:228-243. 3. Liver appears steatotic. 4. Right adrenal adenoma. 5. Aortic atherosclerosis (ICD10-I70.0). Coronary artery calcification. 6. Enlarged pulmonic trunk, indicative of pulmonary arterial hypertension. Electronically Signed   By: Leanna Battles M.D.   On: 07/23/2020 11:49   DG C-Arm 1-60 Min  Result Date: 07/21/2020 CLINICAL DATA:  Elective surgery.  Right hip replacement. EXAM: OPERATIVE RIGHT HIP (WITH PELVIS IF PERFORMED) TECHNIQUE: Fluoroscopic spot image(s) were submitted for interpretation post-operatively. COMPARISON:  None. FINDINGS: Two fluoroscopic spot views obtained in the operating room. Right hip arthroplasty in expected alignment. Total fluoroscopy time 20 seconds. Total dose 2.64 mGy. IMPRESSION: Fluoroscopic spot views after right hip arthroplasty. Electronically Signed   By: Narda Rutherford M.D.   On: 07/21/2020 16:19   DG Hip Port Unilat With Pelvis 1V Right  Result Date: 07/21/2020 CLINICAL DATA:  Postop right hip arthroplasty. EXAM: DG HIP (WITH OR WITHOUT PELVIS) 1V PORT RIGHT COMPARISON:  None. FINDINGS: Right hip arthroplasty in expected alignment. No periprosthetic lucency or fracture. Recent postsurgical change includes air and edema in the soft tissues with lateral skin staples. Left  hip arthroplasty is also noted. IMPRESSION: Right hip arthroplasty without immediate postoperative complication. Electronically Signed   By: Narda Rutherford M.D.   On: 07/21/2020 16:18   DG HIP OPERATIVE UNILAT W OR W/O PELVIS RIGHT  Result Date: 07/21/2020 CLINICAL DATA:  Elective surgery.  Right hip replacement. EXAM: OPERATIVE RIGHT HIP (WITH PELVIS IF PERFORMED) TECHNIQUE: Fluoroscopic spot image(s) were submitted for interpretation post-operatively. COMPARISON:  None. FINDINGS: Two fluoroscopic spot views obtained in the operating room. Right hip arthroplasty in expected alignment. Total fluoroscopy time 20 seconds. Total dose 2.64 mGy. IMPRESSION: Fluoroscopic spot views after right hip arthroplasty. Electronically Signed   By: Narda Rutherford M.D.   On: 07/21/2020 16:19       Follow-up Information    [facility] Follow up.   Why: [facility] will be calling you to set up therapy in the next 24-48 hours from your discharge home. Contact information: [address] [place] [address] [phone]        Eldred Manges, MD Follow up in 2 week(s).   Specialty: Orthopedic Surgery Why: may follow up in [place] office in  2 wks. on a Thursday, call for appt time if you do not already have one.  Contact information: [address] [place] [address] [phone]  Discharge Plan:  discharge to home  Disposition:     Signed: Zonia Kief 07/26/2020, 3:34 PM

## 2020-07-26 NOTE — Care Management (Signed)
[facility] called back to state that they would be unable to provide home health services to the patient.  I spoke with the patient and informed her that [facility] has accepted her as a patient and would be providing home health services instead for PT/OT.  The patient stated that she would like a referral to the pulmonology clinic in [place], [place] [facility] was called and patient was scehduled for July 25, 2020 at 1100 with Dr. Felicie Morn - [address], [place], [place].  Patient was called and notified of both services.

## 2020-08-05 ENCOUNTER — Ambulatory Visit (INDEPENDENT_AMBULATORY_CARE_PROVIDER_SITE_OTHER): Payer: BC Managed Care – PPO

## 2020-08-05 ENCOUNTER — Encounter: Payer: Self-pay | Admitting: Orthopaedic Surgery

## 2020-08-05 ENCOUNTER — Other Ambulatory Visit: Payer: Self-pay

## 2020-08-05 ENCOUNTER — Ambulatory Visit (INDEPENDENT_AMBULATORY_CARE_PROVIDER_SITE_OTHER): Payer: BC Managed Care – PPO | Admitting: Orthopaedic Surgery

## 2020-08-05 VITALS — Ht 63.5 in | Wt 218.0 lb

## 2020-08-05 DIAGNOSIS — Z96641 Presence of right artificial hip joint: Secondary | ICD-10-CM

## 2020-08-05 NOTE — Progress Notes (Signed)
Post-Op Visit Note   Patient: Danielle Schmitt           Date of Birth: 11/05/1956           MRN: [id number] Visit Date: 08/05/2020 PCP: Jonathon Bellows, DO   Assessment & Plan: Post total hip arthroplasty.  Postop she was hypoxic.  Pulmonary diagnosed her with likely idiopathic pulmonary fibrosis which runs in her family.  Her father had it, she has a brother had double lung transplant with that diagnosis.  She also has had a sister had Covid and then ended up with pulmonary fibrosis.  She has appointment with [facility] for evaluation and she is using portable oxygen.  I will check her back again in 5 to 6 weeks.  Staples are removed today.  Chief Complaint:  Chief Complaint  Patient presents with  . Right Hip - Routine Post Op    07/21/2020 Right THA   Visit Diagnoses:  1. Status post total hip replacement, right     Plan:   Follow-Up Instructions: No follow-ups on file.   Orders:  Orders Placed This Encounter  Procedures  . XR HIP UNILAT W OR W/O PELVIS 2-3 VIEWS RIGHT   No orders of the defined types were placed in this encounter.   Imaging: No results found.  PMFS History: Patient Active Problem List   Diagnosis Date Noted  . H/O total hip arthroplasty 07/24/2020  . Arthritis of right hip 07/21/2020  . Unilateral primary osteoarthritis, right hip 07/06/2020  . Pain in right hip 06/09/2020  . Primary osteoarthritis of right knee 10/30/2017  . S/P TKR (total knee replacement) using cement, right 10/30/2017  . Postoperative anemia due to acute blood loss 06/19/2013  . Morbid obesity (HCC) 06/17/2013  . Asthma 06/17/2013   Past Medical History:  Diagnosis Date  . Arthritis    "shoulders, knees, fingers, hips" (10/31/2017)  . Asthma   . B12 deficiency anemia   . Chronic lower back pain   . Complication of anesthesia    Hx: of bronchospasm attack during procedure- hysterectomy  . Degenerative joint disease   . Depression   . Environmental allergies   . History of blood  transfusion    left knee replacement and hip replacement  . Hypertension   . Incontinence of urine    "due to vaginal hysterectomy; I have pessary in place" (10/31/2017)  . Iron deficiency anemia   . Migraine    "any time weather/pressure changes"   . Pneumonia    "several times; last time was ~ 2014" (10/31/2017)  . Pseudogout of knee, right   . Spinal headache    ;Hx: of headache with Epidural; migraines    Family History  Problem Relation Age of Onset  . Cancer - Lung Mother   . Hypertension Mother   . Hypertension Father   . Kidney failure Father   . Hypertension Brother     Past Surgical History:  Procedure Laterality Date  . BACK SURGERY    . CARPAL TUNNEL RELEASE Bilateral   . COLONOSCOPY W/ BIOPSIES AND POLYPECTOMY     Hx: of  . JOINT REPLACEMENT    . KNEE ARTHROPLASTY Left   . KNEE ARTHROSCOPY Bilateral "numerous "  . KNEE RECONSTRUCTION Right    Insoll procedure  . SHOULDER ARTHROSCOPY W/ ROTATOR CUFF REPAIR Right   . SYNOVIAL CYST EXCISION     "inside spinal column"  . TONSILLECTOMY AND ADENOIDECTOMY  1998  . TOTAL HIP ARTHROPLASTY Left 06/17/2013  Procedure: LEFT TOTAL HIP ARTHROPLASTY;  Surgeon: Valeria Batman, MD;  Location: [facility] OR;  Service: Orthopedics;  Laterality: Left;  . TOTAL HIP ARTHROPLASTY Right 07/21/2020   Procedure: RIGHT TOTAL HIP ARTHROPLASTY DIRECT ANTERIOR;  Surgeon: Eldred Manges, MD;  Location: [facility] OR;  Service: Orthopedics;  Laterality: Right;  . TOTAL KNEE ARTHROPLASTY Right 10/30/2017   Procedure: RIGHT TOTAL KNEE ARTHROPLASTY;  Surgeon: Valeria Batman, MD;  Location: [facility] OR;  Service: Orthopedics;  Laterality: Right;  . TOTAL KNEE ARTHROPLASTY Right 10/30/2017  . VAGINAL HYSTERECTOMY     total   Social History   Occupational History  . Not on file  Tobacco Use  . Smoking status: Never Smoker  . Smokeless tobacco: Never Used  Vaping Use  . Vaping Use: Never used  Substance and Sexual Activity  . Alcohol use: Yes    Comment:  10/31/2017 "might have 3 drinks/year"  . Drug use: No  . Sexual activity: Never

## 2020-09-09 ENCOUNTER — Other Ambulatory Visit: Payer: Self-pay

## 2020-09-09 ENCOUNTER — Encounter: Payer: Self-pay | Admitting: Orthopaedic Surgery

## 2020-09-09 ENCOUNTER — Ambulatory Visit (INDEPENDENT_AMBULATORY_CARE_PROVIDER_SITE_OTHER): Payer: BC Managed Care – PPO | Admitting: Orthopaedic Surgery

## 2020-09-09 DIAGNOSIS — J849 Interstitial pulmonary disease, unspecified: Secondary | ICD-10-CM | POA: Insufficient documentation

## 2020-09-09 DIAGNOSIS — Z96641 Presence of right artificial hip joint: Secondary | ICD-10-CM | POA: Insufficient documentation

## 2020-09-09 NOTE — Progress Notes (Signed)
Post-Op Visit Note   Patient: Danielle Schmitt           Date of Birth: 1956-10-03           MRN: [id number] Visit Date: 09/09/2020 PCP: Jonathon Bellows, DO   Assessment & Plan: Continue quad strengthening she will work on supine position with short arc straight leg raising to strengthen hip flexors and quad.  She is happy with the results of surgery.  She has appointment coming up with [facility] for consideration of lung transplantation due to her primary lung disease.  Chief Complaint:  Chief Complaint  Patient presents with  . Right Hip - Follow-up    07/21/2020 Right THA   Visit Diagnoses:  1. Hx of total hip arthroplasty, right   2. Interstitial lung disease (HCC)     Plan: Patient current after right total arthroplasty done 07/21/2020.  In the hospital she was short of breath requiring oxygen supplementation and pulmonary work-up showed that she had interstitial lung disease.  This is severe and she is on 24-hour oxygen and likely has a primary diagnosis of pulmonary fibrosis but apparently has not had biopsy yet and is being referred to [facility] for lung transplantation.  Her hip is doing well she still has some problems going up steps from residual weakness in her leg some this is limited from her shortness of breath.  Hip incisions well-healed she is gotten good relief of her hip pain.  Leg lengths are equal.  Follow-Up Instructions: No follow-ups on file.   Orders:  No orders of the defined types were placed in this encounter.  No orders of the defined types were placed in this encounter.   Imaging: No results found.  PMFS History: Patient Active Problem List   Diagnosis Date Noted  . Hx of total hip arthroplasty, right 09/09/2020  . Interstitial lung disease (HCC) 09/09/2020  . H/O total hip arthroplasty 07/24/2020  . Arthritis of right hip 07/21/2020  . Pain in right hip 06/09/2020  . Primary osteoarthritis of right knee 10/30/2017  . S/P TKR (total knee  replacement) using cement, right 10/30/2017  . Postoperative anemia due to acute blood loss 06/19/2013  . Morbid obesity (HCC) 06/17/2013  . Asthma 06/17/2013   Past Medical History:  Diagnosis Date  . Arthritis    "shoulders, knees, fingers, hips" (10/31/2017)  . Asthma   . B12 deficiency anemia   . Chronic lower back pain   . Complication of anesthesia    Hx: of bronchospasm attack during procedure- hysterectomy  . Degenerative joint disease   . Depression   . Environmental allergies   . History of blood transfusion    left knee replacement and hip replacement  . Hypertension   . Incontinence of urine    "due to vaginal hysterectomy; I have pessary in place" (10/31/2017)  . Iron deficiency anemia   . Migraine    "any time weather/pressure changes"   . Pneumonia    "several times; last time was ~ 2014" (10/31/2017)  . Pseudogout of knee, right   . Spinal headache    ;Hx: of headache with Epidural; migraines    Family History  Problem Relation Age of Onset  . Cancer - Lung Mother   . Hypertension Mother   . Hypertension Father   . Kidney failure Father   . Hypertension Brother     Past Surgical History:  Procedure Laterality Date  . BACK SURGERY    . CARPAL TUNNEL RELEASE  Bilateral   . COLONOSCOPY W/ BIOPSIES AND POLYPECTOMY     Hx: of  . JOINT REPLACEMENT    . KNEE ARTHROPLASTY Left   . KNEE ARTHROSCOPY Bilateral "numerous "  . KNEE RECONSTRUCTION Right    Insoll procedure  . SHOULDER ARTHROSCOPY W/ ROTATOR CUFF REPAIR Right   . SYNOVIAL CYST EXCISION     "inside spinal column"  . TONSILLECTOMY AND ADENOIDECTOMY  1998  . TOTAL HIP ARTHROPLASTY Left 06/17/2013   Procedure: LEFT TOTAL HIP ARTHROPLASTY;  Surgeon: Valeria Batman, MD;  Location: [facility] OR;  Service: Orthopedics;  Laterality: Left;  . TOTAL HIP ARTHROPLASTY Right 07/21/2020   Procedure: RIGHT TOTAL HIP ARTHROPLASTY DIRECT ANTERIOR;  Surgeon: Eldred Manges, MD;  Location: [facility] OR;  Service: Orthopedics;   Laterality: Right;  . TOTAL KNEE ARTHROPLASTY Right 10/30/2017   Procedure: RIGHT TOTAL KNEE ARTHROPLASTY;  Surgeon: Valeria Batman, MD;  Location: [facility] OR;  Service: Orthopedics;  Laterality: Right;  . TOTAL KNEE ARTHROPLASTY Right 10/30/2017  . VAGINAL HYSTERECTOMY     total   Social History   Occupational History  . Not on file  Tobacco Use  . Smoking status: Never Smoker  . Smokeless tobacco: Never Used  Vaping Use  . Vaping Use: Never used  Substance and Sexual Activity  . Alcohol use: Yes    Comment: 10/31/2017 "might have 3 drinks/year"  . Drug use: No  . Sexual activity: Never

## 2021-06-07 ENCOUNTER — Other Ambulatory Visit: Payer: Self-pay | Admitting: Orthopaedic Surgery

## 2021-06-07 DIAGNOSIS — M25551 Pain in right hip: Secondary | ICD-10-CM

## 2021-06-24 DEATH — deceased

## 2022-04-13 IMAGING — CT CT CHEST HIGH RESOLUTION W/O CM
2 of 6 series · 14 of 36 positions shown, 17 images · non-contrast
Comparison: None.

CLINICAL DATA: Shortness of breath.  Asthma.  Recent hip surgery.

EXAM:
CT CHEST WITHOUT CONTRAST
TECHNIQUE: Multidetector CT imaging of the chest was performed following the
standard protocol without intravenous contrast. High resolution
imaging of the lungs, as well as inspiratory and expiratory imaging,
was performed.

[Series 7: chest w/o 2mm st cor · coronal · non-contrast · 0.62mm/px · 3 of 124 slices shown]
[im 25/124  lung]
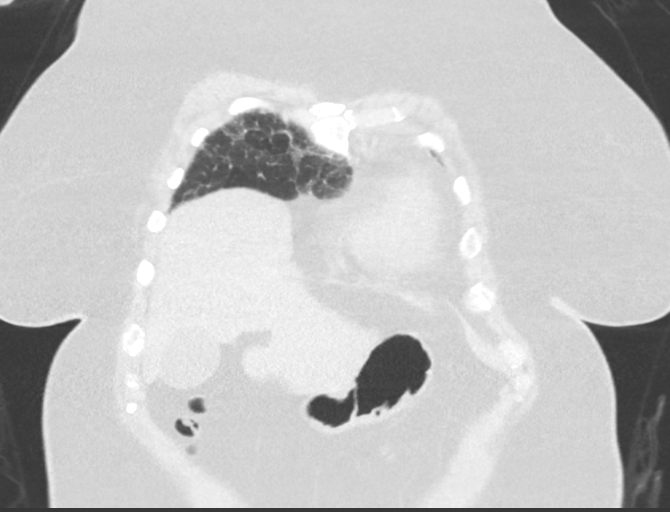
[im 50/124  lung]
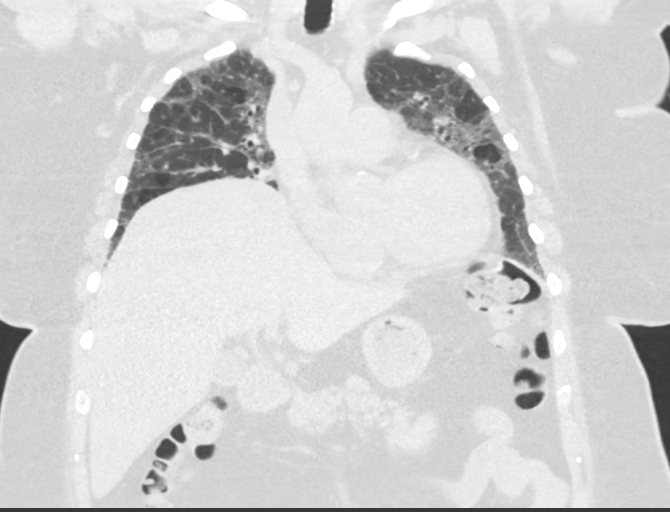
[im 74/124  lung]
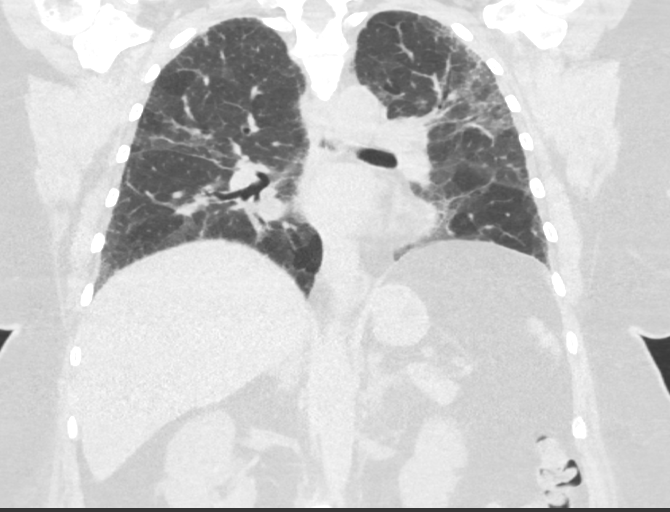

[Series 9: chest w/o bone thins · axial · non-contrast · 0.88mm/px · z∈[+1134,+1411]mm · 11 of 315 slices shown, 14 images]
[im 19/315  mediastinal]
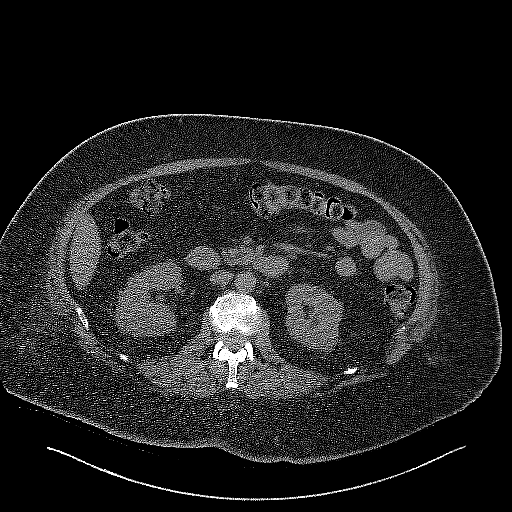
[im 19/315  lung]
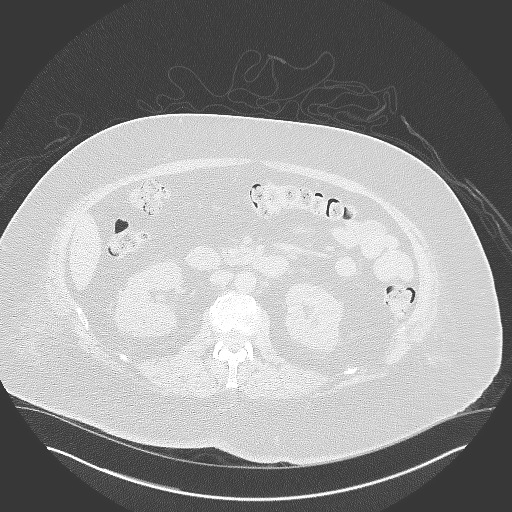
[im 56/315  lung]
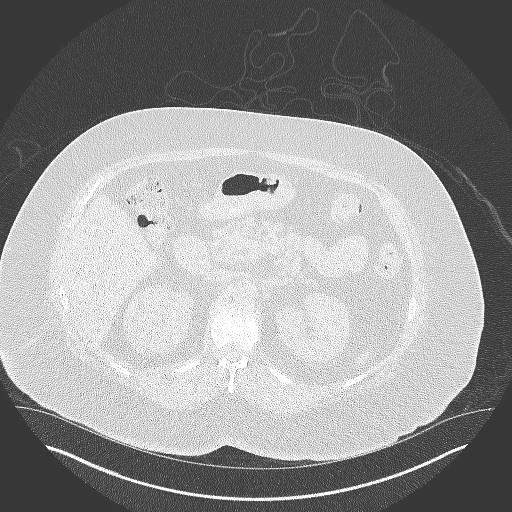
[im 74/315  lung]
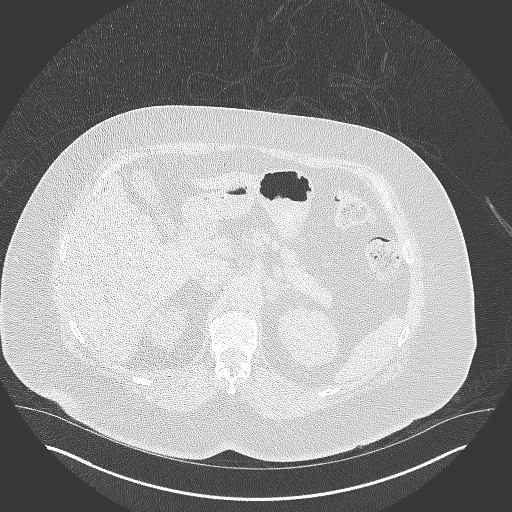
[im 111/315  lung]
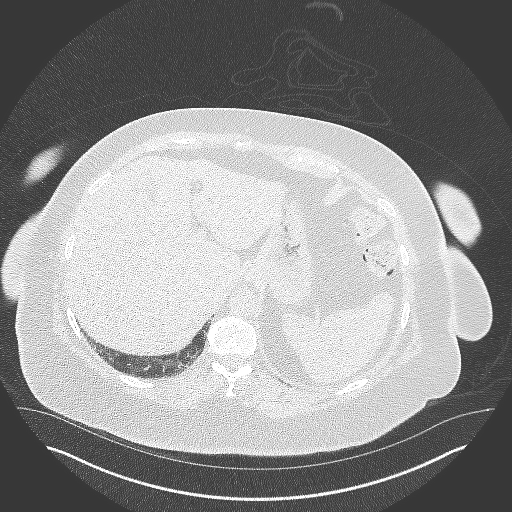
[im 130/315  mediastinal]
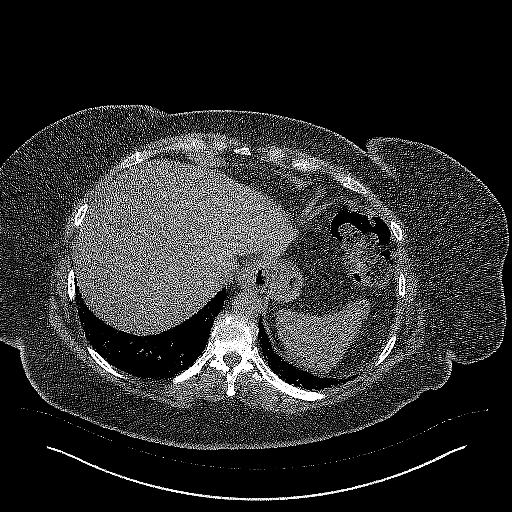
[im 130/315  lung]
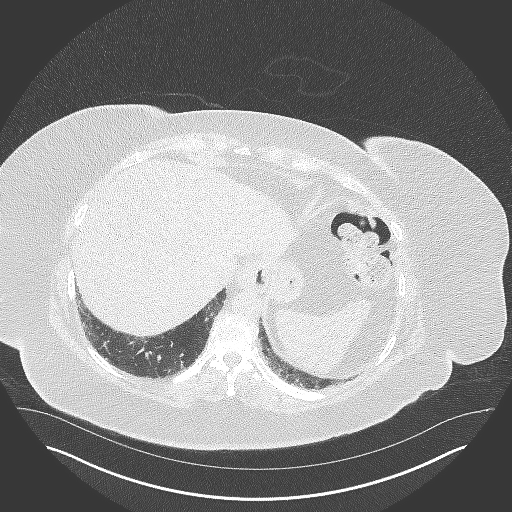
[im 167/315  lung]
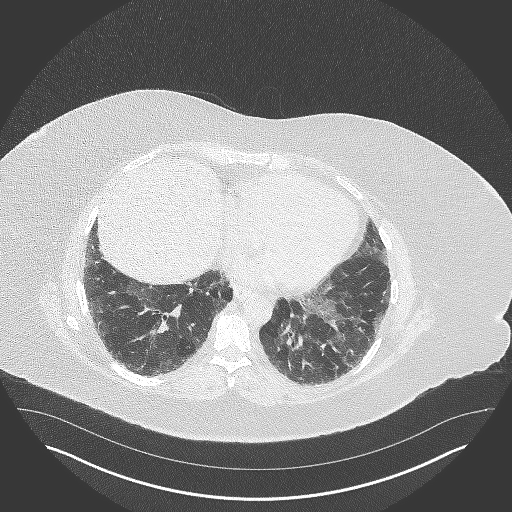
[im 185/315  lung]
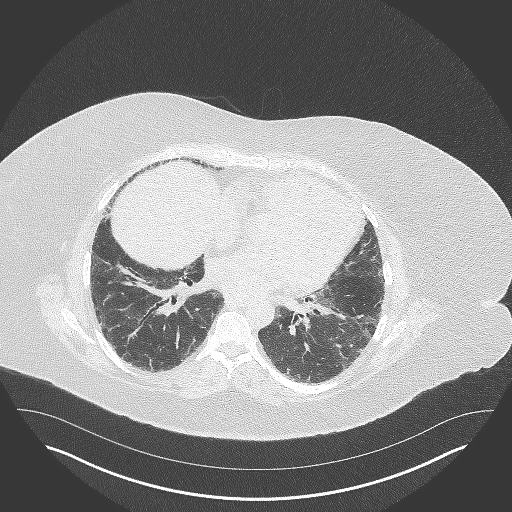
[im 204/315  lung]
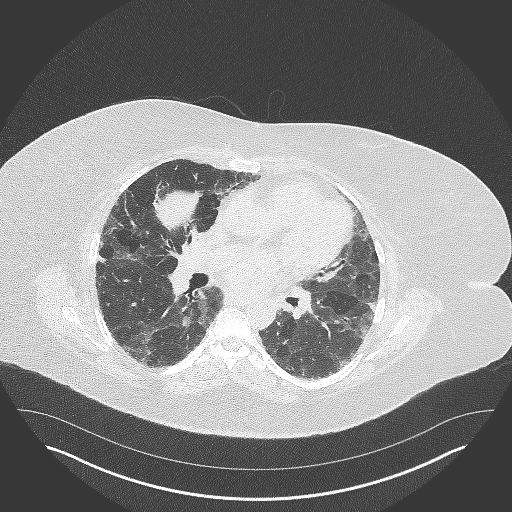
[im 241/315  mediastinal]
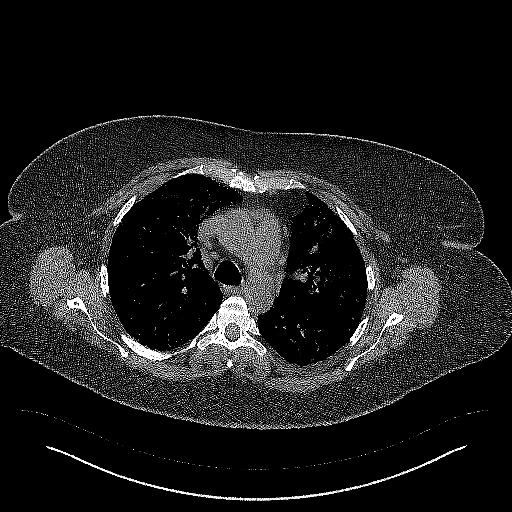
[im 241/315  lung]
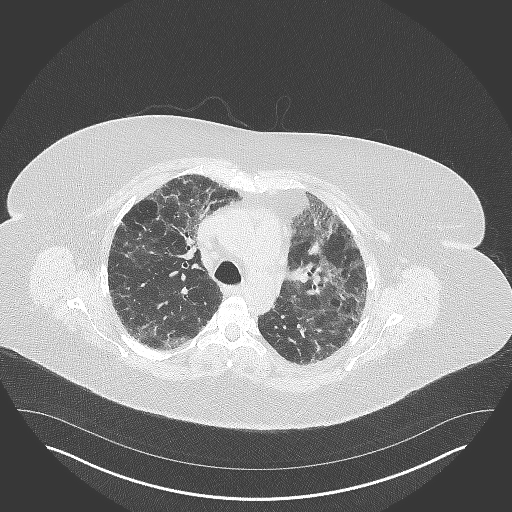
[im 259/315  lung]
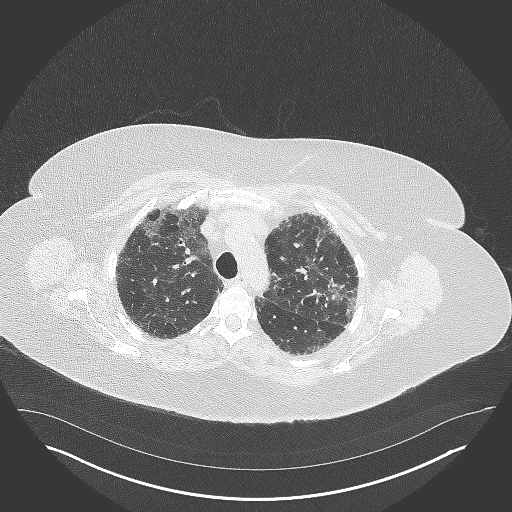
[im 296/315  lung]
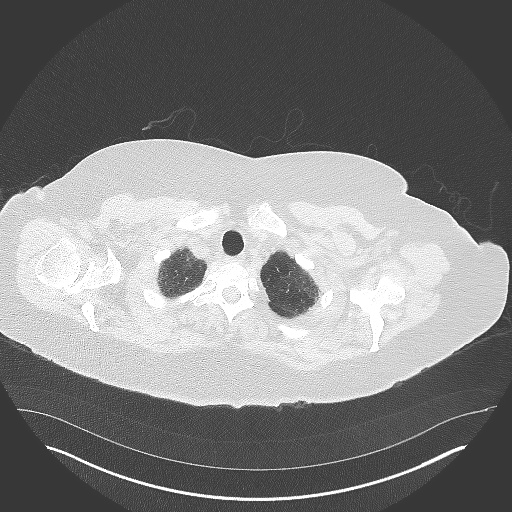

[14 of 36 positions shown; findings below may reference images not displayed]

FINDINGS: Cardiovascular: Atherosclerotic calcification of the aortic valve
and coronary arteries. Pulmonic trunk and heart are enlarged. No
pericardial effusion.

Mediastinum/Nodes: Mediastinal lymph nodes are not enlarged by CT
size criteria. Hilar regions are difficult to definitively evaluate
without IV contrast. No axillary adenopathy. Esophagus is grossly
unremarkable. Tiny hiatal hernia.

Lungs/Pleura: There is patchy pulmonary parenchymal ground-glass
with interstitial coarsening, traction
bronchiectasis/bronchiolectasis and mild architectural distortion.
Findings may be slightly upper/midlung zone predominant. Mild air
trapping. 5 mm left lower lobe nodule (6/69). No pleural fluid.
Airway is unremarkable.

Upper Abdomen: Right hemidiaphragm is elevated. Liver is somewhat
heterogeneous in appearance. Visualized portions of the liver and
gallbladder are otherwise unremarkable. Fluid density nodule in the
body of the right adrenal gland measures 1.3 cm. Visualized portion
of the left adrenal gland is unremarkable. 11 mm low-attenuation
lesion in the upper pole right kidney is too small to characterize.
Visualized portions of the kidneys, spleen, pancreas, stomach and
bowel are otherwise unremarkable with the exception of a small
hiatal hernia. No upper abdominal adenopathy.

Musculoskeletal: Degenerative changes in the spine. No worrisome
lytic or sclerotic lesions.
IMPRESSION: 1. Pulmonary parenchymal pattern of fibrosis, together with mild air
trapping, can be seen with chronic hypersensitivity pneumonitis or
fibrotic nonspecific interstitial pneumonitis. Findings are
suggestive of an alternative diagnosis (not UIP) per consensus
guidelines: Diagnosis of Idiopathic Pulmonary Fibrosis: An Official
ATS/ERS/JRS/ALAT Clinical Practice Guideline. Am J Respir Crit Care
Med Vol 198, Modeo 5, ppe88-e[DATE].
2. 5 mm left lower lobe nodule. No follow-up needed if patient is
low-risk. Non-contrast chest CT can be considered in 12 months if
patient is high-risk. This recommendation follows the consensus
statement: Guidelines for Management of Incidental Pulmonary Nodules
Detected on CT Images: From the [HOSPITAL] 4367; Radiology
4367; [DATE].
3. Liver appears steatotic.
4. Right adrenal adenoma.
5. Aortic atherosclerosis (G15C5-G80.0). Coronary artery
calcification.
6. Enlarged pulmonic trunk, indicative of pulmonary arterial
hypertension.
